# Patient Record
Sex: Female | Born: 1980
Health system: Southern US, Community
[De-identification: ages and names within clinical notes are randomized; demographics above are authoritative.]

## PROBLEM LIST (undated history)

## (undated) DIAGNOSIS — K219 Gastro-esophageal reflux disease without esophagitis: Secondary | ICD-10-CM

## (undated) DIAGNOSIS — IMO0001 Reserved for inherently not codable concepts without codable children: Secondary | ICD-10-CM

## (undated) DIAGNOSIS — J069 Acute upper respiratory infection, unspecified: Secondary | ICD-10-CM

## (undated) HISTORY — DX: Gastro-esophageal reflux disease without esophagitis: K21.9

## (undated) HISTORY — PX: BACK SURGERY: SHX140

## (undated) HISTORY — DX: Reserved for inherently not codable concepts without codable children: IMO0001

## (undated) HISTORY — DX: Acute upper respiratory infection, unspecified: J06.9

---

## 1999-10-17 ENCOUNTER — Other Ambulatory Visit: Admission: RE | Admit: 1999-10-17 | Discharge: 1999-10-17 | Payer: Self-pay | Admitting: Obstetrics and Gynecology

## 2002-10-27 ENCOUNTER — Other Ambulatory Visit: Admission: RE | Admit: 2002-10-27 | Discharge: 2002-10-27 | Payer: Self-pay | Admitting: Obstetrics and Gynecology

## 2003-12-07 ENCOUNTER — Other Ambulatory Visit: Admission: RE | Admit: 2003-12-07 | Discharge: 2003-12-07 | Payer: Self-pay | Admitting: Obstetrics and Gynecology

## 2003-12-08 ENCOUNTER — Emergency Department (HOSPITAL_COMMUNITY): Admission: EM | Admit: 2003-12-08 | Discharge: 2003-12-09 | Payer: Self-pay | Admitting: Emergency Medicine

## 2005-01-27 ENCOUNTER — Other Ambulatory Visit: Admission: RE | Admit: 2005-01-27 | Discharge: 2005-01-27 | Payer: Self-pay | Admitting: Obstetrics and Gynecology

## 2012-12-01 ENCOUNTER — Ambulatory Visit (INDEPENDENT_AMBULATORY_CARE_PROVIDER_SITE_OTHER): Payer: BC Managed Care – HMO | Admitting: Family Medicine

## 2012-12-01 ENCOUNTER — Encounter: Payer: Self-pay | Admitting: Family Medicine

## 2012-12-01 VITALS — BP 103/67 | Temp 98.7°F | Wt 109.6 lb

## 2012-12-01 DIAGNOSIS — J329 Chronic sinusitis, unspecified: Secondary | ICD-10-CM

## 2012-12-01 MED ORDER — CIPROFLOXACIN HCL 500 MG PO TABS: 500.0000 mg | ORAL_TABLET | Freq: Two times a day (BID) | ORAL | Status: AC

## 2012-12-01 NOTE — Progress Notes (Signed)
  Subjective:    Patient ID: Kathy Graham, female    DOB: 1981/03/30, 32 y.o.   MRN: [id number]  HPI Patient arrives office for evaluation. Headache. Congestion. Sore throat. Some drainage. Intermittent cough. Diminished energy. Under tremendous stress right now. Brother has just been diagnosed with malignant melanoma. Significant trouble sleeping   Review of Systems No vomiting no fever no rash ROS otherwise negative.    Objective:   Physical Exam  Alert no acute distress. HEENT normal except for frontal tenderness.. Some tender anterior nodes also. Neuro intact. Lungs clear. Heart regular in rhythm.      Assessment & Plan:  Impression sinusitis-discussed. Plan Cipro 500 twice a day 10 days. Symptomatic care discussed.

## 2012-12-05 ENCOUNTER — Encounter: Payer: Self-pay | Admitting: *Deleted

## 2012-12-05 ENCOUNTER — Telehealth: Payer: Self-pay | Admitting: Family Medicine

## 2012-12-05 NOTE — Telephone Encounter (Signed)
Discussed symptoms with patient. Currently taking antibiotics and has been on them for 3 days. Agreed to give antibiotics couple more days for results.

## 2012-12-05 NOTE — Telephone Encounter (Signed)
Having symptoms (Sinus pain and pressure, Stomach pain) that are bothering her, she can not sleep at night.  Sinuses very dry.  Would like to speak with a Nurse.  Please call.  Thanks

## 2012-12-07 ENCOUNTER — Encounter (HOSPITAL_COMMUNITY): Payer: Self-pay

## 2012-12-07 ENCOUNTER — Telehealth: Payer: Self-pay | Admitting: Family Medicine

## 2012-12-07 ENCOUNTER — Emergency Department (HOSPITAL_COMMUNITY)
Admission: EM | Admit: 2012-12-07 | Discharge: 2012-12-07 | Disposition: A | Discharge status: Home / Self Care | Payer: BC Managed Care – PPO | Attending: Emergency Medicine | Admitting: Emergency Medicine

## 2012-12-07 DIAGNOSIS — Z8719 Personal history of other diseases of the digestive system: Secondary | ICD-10-CM | POA: Insufficient documentation

## 2012-12-07 DIAGNOSIS — F411 Generalized anxiety disorder: Secondary | ICD-10-CM | POA: Insufficient documentation

## 2012-12-07 DIAGNOSIS — R000 Tachycardia, unspecified: Secondary | ICD-10-CM | POA: Insufficient documentation

## 2012-12-07 DIAGNOSIS — F419 Anxiety disorder, unspecified: Secondary | ICD-10-CM

## 2012-12-07 DIAGNOSIS — G4700 Insomnia, unspecified: Secondary | ICD-10-CM | POA: Insufficient documentation

## 2012-12-07 DIAGNOSIS — Z79899 Other long term (current) drug therapy: Secondary | ICD-10-CM | POA: Insufficient documentation

## 2012-12-07 MED ORDER — ALPRAZOLAM 0.25 MG PO TABS: 0.2500 mg | ORAL_TABLET | Freq: Every evening | ORAL | Status: DC | PRN

## 2012-12-07 NOTE — ED Notes (Signed)
Pt c/o of anxiety several days with increased onset today

## 2012-12-07 NOTE — Telephone Encounter (Signed)
Calandra,plz call pt back (again) with numbness in fingers, definietly agree with visit to review symptoms and possible interventions. Too complicated for just a phone call

## 2012-12-07 NOTE — ED Provider Notes (Signed)
History     CSN: [id number]  Arrival date & time 12/07/12  1442   First MD Initiated Contact with Patient 12/07/12 1511      Chief Complaint  Patient presents with  . Panic Attack    (Consider location/radiation/quality/duration/timing/severity/associated sxs/prior treatment) The history is provided by the patient.   patient here with increased anxiety x4 days. Notes insomnia as well as increased stressors at home. Was at work today and became very upset. No hallucinations. No suicidal ideations. Denies any prior history of anxiety or panic disorder. Symptoms are better when she relaxes and worse with environmental stimuli. Does note she also has been increased amounts of caffiene  Past Medical History  Diagnosis Date  . Reflux     History reviewed. No pertinent past surgical history.  No family history on file.  History  Substance Use Topics  . Smoking status: Never Smoker   . Smokeless tobacco: Not on file  . Alcohol Use: Not on file    OB History   Grav Para Term Preterm Abortions TAB SAB Ect Mult Living                  Review of Systems  All other systems reviewed and are negative.    Allergies  Augmentin and Cefzil  Home Medications   Current Outpatient Rx  Name  Route  Sig  Dispense  Refill  . ciprofloxacin (CIPRO) 500 MG tablet   Oral   Take 1 tablet (500 mg total) by mouth 2 (two) times daily.   20 tablet   0   . fexofenadine (ALLEGRA) 180 MG tablet   Oral   Take 180 mg by mouth daily.           BP 150/82  Pulse 116  Temp(Src) 97.6 F (36.4 C) (Oral)  Resp 18  SpO2 100%  LMP 11/24/2012  Physical Exam  Nursing note and vitals reviewed. Constitutional: She is oriented to person, place, and time. She appears well-developed and well-nourished.  Non-toxic appearance. No distress.  HENT:  Head: Normocephalic and atraumatic.  Eyes: Conjunctivae, EOM and lids are normal. Pupils are equal, round, and reactive to light.  Neck: Normal  range of motion. Neck supple. No tracheal deviation present. No mass present.  Cardiovascular: Regular rhythm and normal heart sounds.  Tachycardia present.  Exam reveals no gallop.   No murmur heard. Pulmonary/Chest: Effort normal and breath sounds normal. No stridor. No respiratory distress. She has no decreased breath sounds. She has no wheezes. She has no rhonchi. She has no rales.  Abdominal: Soft. Normal appearance and bowel sounds are normal. She exhibits no distension. There is no tenderness. There is no rebound and no CVA tenderness.  Musculoskeletal: Normal range of motion. She exhibits no edema and no tenderness.  Neurological: She is alert and oriented to person, place, and time. She has normal strength. No cranial nerve deficit or sensory deficit. GCS eye subscore is 4. GCS verbal subscore is 5. GCS motor subscore is 6.  Skin: Skin is warm and dry. No abrasion and no rash noted.  Psychiatric: Her speech is normal and behavior is normal. Her mood appears anxious.    ED Course  Procedures (including critical care time)  Labs Reviewed - No data to display No results found.   No diagnosis found.    MDM  Pt with signs and sx of anxiety, likely expalnation of her tachycardia, will rx xanax and she has f/u her pcp for tomorrow  Toy Baker, MD 12/07/12 1539

## 2012-12-07 NOTE — Telephone Encounter (Signed)
Pt states that Dr Brett Canales needs to call her himself, not a nurse. She wants to discuss things with him about something she needs extending from her visit with him on 12/01/12. Pt states Dr Brett Canales said she could call back an ask for him specifically for any issues.

## 2012-12-07 NOTE — Telephone Encounter (Signed)
Please see previous telephone message also.

## 2012-12-07 NOTE — Telephone Encounter (Signed)
Patient was given an appointment for 12/08/12 at 2:30pm.

## 2012-12-07 NOTE — Telephone Encounter (Signed)
Patient states that she is experiencing some anxiety trouble and having numbness in her finger due to her job requiring her to stay on her elbows a lot. Advised patient to schedule OV to discuss. Transferred to front desk for appointment.

## 2012-12-07 NOTE — ED Notes (Signed)
MD at bedside. 

## 2012-12-07 NOTE — Telephone Encounter (Signed)
Patient notified and will keep appointment for 12/08/12 at 2:30 pm.

## 2012-12-07 NOTE — Telephone Encounter (Signed)
Patient says that she needs someone to give her a call back reguarding her sinus infection that she was diagnosed with on last office visit

## 2012-12-08 ENCOUNTER — Encounter: Payer: Self-pay | Admitting: Family Medicine

## 2012-12-08 ENCOUNTER — Ambulatory Visit (INDEPENDENT_AMBULATORY_CARE_PROVIDER_SITE_OTHER): Payer: BC Managed Care – HMO | Admitting: Family Medicine

## 2012-12-08 VITALS — BP 112/72 | Wt 109.6 lb

## 2012-12-08 DIAGNOSIS — R209 Unspecified disturbances of skin sensation: Secondary | ICD-10-CM

## 2012-12-08 DIAGNOSIS — F411 Generalized anxiety disorder: Secondary | ICD-10-CM

## 2012-12-08 DIAGNOSIS — R202 Paresthesia of skin: Secondary | ICD-10-CM

## 2012-12-08 NOTE — Progress Notes (Signed)
  Subjective:    Patient ID: Kathy Graham, female    DOB: April 21, 1981, 32 y.o.   MRN: [id number]  HPI Had trouble sleeping. Difficulty breathing. Mind racing at times.   Numbness in ulnargion. Patient noted both arms and hands were numb and tingly last night when she had to go to the emergency room.  Felt vertigo and imbalance.  Felt numbness and tingling. Heart racing. bp was up. Caffeine use up. Daily walks.  Hx of spell in past similar. Anxious.   Major insomnia. Hx of some in the past.  Reflux at times. No hx in past. Review of Systems Some weight loss. No true chest pain. No abdominal pain.    Objective:   Physical Exam  Alert slight nasal congestion. HEENT otherwise normal. Lungs clear heart regular rate and rhythm. Sensory exam within normal limits. Cerebellar function normal. Neurological exam normal.      Assessment & Plan:  Impression #1 resolving sinusitis discussed. #2 paresthesias likely secondary to hyperventilation some times. Doubt serious neurological etiology. #3 anxiety with element of depression. Plan patient would like to consider counseling before starting on major medicines. We will give her prescription for 0.25 mg Xanax to use as needed. Regular diet and exercise encourage. WSL

## 2012-12-09 DIAGNOSIS — F32A Depression, unspecified: Secondary | ICD-10-CM | POA: Insufficient documentation

## 2012-12-09 DIAGNOSIS — F419 Anxiety disorder, unspecified: Secondary | ICD-10-CM | POA: Insufficient documentation

## 2012-12-09 DIAGNOSIS — F411 Generalized anxiety disorder: Secondary | ICD-10-CM | POA: Insufficient documentation

## 2012-12-09 DIAGNOSIS — R202 Paresthesia of skin: Secondary | ICD-10-CM | POA: Insufficient documentation

## 2013-01-18 ENCOUNTER — Ambulatory Visit (INDEPENDENT_AMBULATORY_CARE_PROVIDER_SITE_OTHER): Payer: BC Managed Care – PPO | Admitting: Psychiatry

## 2013-01-18 DIAGNOSIS — F4323 Adjustment disorder with mixed anxiety and depressed mood: Secondary | ICD-10-CM

## 2013-05-03 ENCOUNTER — Ambulatory Visit (INDEPENDENT_AMBULATORY_CARE_PROVIDER_SITE_OTHER): Payer: BC Managed Care – HMO | Admitting: Family Medicine

## 2013-05-03 ENCOUNTER — Encounter: Payer: Self-pay | Admitting: Family Medicine

## 2013-05-03 VITALS — BP 100/62 | Temp 99.0°F | Ht 63.0 in | Wt 115.8 lb

## 2013-05-03 DIAGNOSIS — J019 Acute sinusitis, unspecified: Secondary | ICD-10-CM

## 2013-05-03 NOTE — Progress Notes (Signed)
  Subjective:    Patient ID: Kathy Graham, female    DOB: [birth year], 32 y.o.   MRN: [id number]  Cough This is a new problem. The current episode started in the past 7 days. Associated symptoms include ear pain and a fever. Associated symptoms comments: Sinus pressure, runny nose. Treatments tried: vitamin C and salt water gargle. The treatment provided mild relief.   PMH benign   Review of Systems  Constitutional: Positive for fever.  HENT: Positive for ear pain.   Respiratory: Positive for cough.        Objective:   Physical Exam Eardrums normal mild sinus tenderness Lungs are clear hearts regular      Assessment & Plan:  Viral URI with possible secondary sinusitis if not improving in over the course of next few days to let us know no antibiotics currently

## 2013-05-03 NOTE — Patient Instructions (Signed)
Keep taking Allegra daily  Also may use the Zpack

## 2014-03-11 ENCOUNTER — Encounter (HOSPITAL_COMMUNITY): Payer: Self-pay | Admitting: Emergency Medicine

## 2014-03-11 ENCOUNTER — Emergency Department (HOSPITAL_COMMUNITY): Payer: BC Managed Care – PPO

## 2014-03-11 ENCOUNTER — Emergency Department (HOSPITAL_COMMUNITY)
Admission: EM | Admit: 2014-03-11 | Discharge: 2014-03-11 | Disposition: A | Discharge status: Home / Self Care | Payer: BC Managed Care – PPO | Attending: Emergency Medicine | Admitting: Emergency Medicine

## 2014-03-11 DIAGNOSIS — Z23 Encounter for immunization: Secondary | ICD-10-CM | POA: Insufficient documentation

## 2014-03-11 DIAGNOSIS — Z8719 Personal history of other diseases of the digestive system: Secondary | ICD-10-CM | POA: Diagnosis not present

## 2014-03-11 DIAGNOSIS — S61409A Unspecified open wound of unspecified hand, initial encounter: Secondary | ICD-10-CM | POA: Insufficient documentation

## 2014-03-11 DIAGNOSIS — Y9289 Other specified places as the place of occurrence of the external cause: Secondary | ICD-10-CM | POA: Diagnosis not present

## 2014-03-11 DIAGNOSIS — W268XXA Contact with other sharp object(s), not elsewhere classified, initial encounter: Secondary | ICD-10-CM | POA: Diagnosis not present

## 2014-03-11 DIAGNOSIS — Y9389 Activity, other specified: Secondary | ICD-10-CM | POA: Insufficient documentation

## 2014-03-11 DIAGNOSIS — S61411A Laceration without foreign body of right hand, initial encounter: Secondary | ICD-10-CM

## 2014-03-11 DIAGNOSIS — Z79899 Other long term (current) drug therapy: Secondary | ICD-10-CM | POA: Insufficient documentation

## 2014-03-11 MED ORDER — TETANUS-DIPHTH-ACELL PERTUSSIS 5-2.5-18.5 LF-MCG/0.5 IM SUSP
0.5000 mL | Freq: Once | INTRAMUSCULAR | Status: AC
  Administered 2014-03-11: 0.5 mL via INTRAMUSCULAR
  Filled 2014-03-11: qty 0.5

## 2014-03-11 MED ORDER — ACETAMINOPHEN-CODEINE #3 300-30 MG PO TABS
1.0000 | ORAL_TABLET | Freq: Four times a day (QID) | ORAL | Status: DC | PRN
Start: 2014-03-11 — End: 2014-03-14

## 2014-03-11 MED ORDER — NAPROXEN 500 MG PO TABS
500.0000 mg | ORAL_TABLET | Freq: Two times a day (BID) | ORAL | Status: DC | PRN
Start: 2014-03-11 — End: 2014-03-14

## 2014-03-11 NOTE — ED Notes (Signed)
Pt states that she was washing dishes and the glass broke and cut her right hand; pt with approx 2cm laceration to outer rt hand below 5th finger; bleeding controlled; clean dressing applied

## 2014-03-11 NOTE — ED Provider Notes (Signed)
CSN: [id number]     Arrival date & time 03/11/14  2011 History  This chart was scribed for a non-physician practitioner, Patty Sermons Camprubi-Soms, PA-C working with Arbie Cookey, MD by Martinique Peace, ED Scribe. The patient was seen in [facility]. The patient's care was started at 9:30 PM.      Chief Complaint  Patient presents with  . Extremity Laceration     Patient is a 33 y.o. female presenting with skin laceration. The history is provided by the patient. No language interpreter was used.  Laceration Location:  Hand Hand laceration location:  R hand (right 5th digit) Length (cm):  0.5 Depth:  Cutaneous Bleeding: controlled   Time since incident:  30 minutes Laceration mechanism:  Broken glass Pain details:    Quality: no pain.   Severity:  No pain   Progression:  Resolved Foreign body present:  No foreign bodies Relieved by:  Pressure Worsened by:  Nothing tried Ineffective treatments:  None tried Tetanus status:  Out of date   HPI Comments: Kathy Graham is a 33 y.o. female who presents to the Emergency Department complaining of laceration to right hand onset earlier today that occurred while pt was washing dishes and cut her hand on her hand a piece of glass. She states that she immediately applied pressure to wound and was able to get bleeding controlled. States the pain was well controlled. Pt reports last tetanus shot was in 2008. She denies any numbness or weakness to affected area, no weakness or loss of ROM to digits, no swelling or drainage. She further denies any issues with her blood that could make things worse.    Past Medical History  Diagnosis Date  . Reflux    Past Surgical History  Procedure Laterality Date  . Back surgery     No family history on file. History  Substance Use Topics  . Smoking status: Never Smoker   . Smokeless tobacco: Not on file  . Alcohol Use: No   OB History   Grav Para Term Preterm Abortions TAB SAB Ect Mult  Living                 Review of Systems  Musculoskeletal: Negative for joint swelling and myalgias.  Skin: Positive for wound (laceration to right hand).  Neurological: Negative for weakness and numbness.  Hematological: Negative for adenopathy. Does not bruise/bleed easily.   10 Systems reviewed and are negative for acute change except as noted in the HPI.    Allergies  Augmentin and Cefzil  Home Medications   Prior to Admission medications   Medication Sig Start Date End Date Taking? Authorizing Provider  drospirenone-ethinyl estradiol (YAZ,GIANVI,LORYNA) 3-0.02 MG tablet Take 1 tablet by mouth daily.   Yes Historical Provider, MD  fexofenadine (ALLEGRA) 180 MG tablet Take 180 mg by mouth daily.   Yes Historical Provider, MD  Multiple Vitamin (MULTIVITAMIN WITH MINERALS) TABS tablet Take 1 tablet by mouth daily.   Yes Historical Provider, MD   BP 121/70  Temp(Src) 98.6 F (37 C) (Oral)  Resp 16  SpO2 100%  LMP 03/04/2014 Physical Exam  Nursing note and vitals reviewed. Constitutional: She is oriented to person, place, and time. Vital signs are normal. She appears well-developed and well-nourished. No distress.  HENT:  Head: Normocephalic and atraumatic.  Mouth/Throat: Mucous membranes are normal.  Eyes: Conjunctivae and EOM are normal.  Neck: Normal range of motion. Neck supple.  Cardiovascular: Normal rate and intact distal pulses.  Distal pulses intact, cap refill intact in all digits  Pulmonary/Chest: Effort normal. No respiratory distress.  Abdominal: Normal appearance. She exhibits no distension.  Musculoskeletal: Normal range of motion.       Right hand: She exhibits laceration. She exhibits normal range of motion, no tenderness, normal two-point discrimination, normal capillary refill and no swelling. Normal sensation noted. Normal strength noted.       Hands: FROM intact in R hand at MCP, PIP, and DIP joints of all digits, no TTP or swelling, small 0.5cm  laceration just proximal to MCP joint of 5th digit. Bleeding well controlled. No FBs visualized. Cap refill brisk and present, sensation intact in all digits including with 2pt discrimination, strength 5/5 with all movements.  Neurological: She is alert and oriented to person, place, and time. She has normal strength. No sensory deficit.  Skin: Skin is warm and dry. Laceration noted.  0.5cm superficial lac to R hand, palmar aspect, just proximal to MCP joint of 5th digit, bleeding controlled  Psychiatric: She has a normal mood and affect. Her behavior is normal.    ED Course  LACERATION REPAIR Date/Time: 03/11/2014 10:00 PM Performed by: CAMPRUBI-SOMS, Chrystian Ressler STRUPP Authorized by: Corine Shelter Consent: Verbal consent obtained. Risks and benefits: risks, benefits and alternatives were discussed Consent given by: patient Patient understanding: patient states understanding of the procedure being performed Patient consent: the patient's understanding of the procedure matches consent given Patient identity confirmed: verbally with patient Body area: upper extremity Location details: right hand Laceration length: 0.5 cm Foreign bodies: no foreign bodies Tendon involvement: none Nerve involvement: none Vascular damage: no Patient sedated: no Preparation: Patient was prepped and draped in the usual sterile fashion. Irrigation solution: saline Irrigation method: jet lavage Amount of cleaning: standard Debridement: none Degree of undermining: none Skin closure: glue Approximation: close Approximation difficulty: simple Dressing: 4x4 sterile gauze Patient tolerance: Patient tolerated the procedure well with no immediate complications.   (including critical care time) Labs Review Labs Reviewed - No data to display   No results found for this or any previous visit. No results found.   Imaging Review Dg Hand Complete Right  03/11/2014   CLINICAL DATA:  Laceration  to fifth metacarpal region of the right hand.  EXAM: RIGHT HAND - COMPLETE 3+ VIEW  COMPARISON:  None.  FINDINGS: No fracture. Joints are normally aligned. No radiopaque foreign bodies.  IMPRESSION: No fracture or radiopaque foreign body.   Electronically Signed   By: Lajean Manes M.D.   On: 03/11/2014 21:18     EKG Interpretation None     Medications - No data to display  9:34 PM- Treatment plan was discussed with patient who verbalizes understanding and agrees which includes glue to close wound and tetanus shot.   MDM   Final diagnoses:  Hand laceration, right, initial encounter   33y/o female with small superficial lac to R hand, proximal to 5th MCP joint, neurovascularly intact without any tendon or nerve involvement. X-ray obtained showed no retained foreign body. Wound closed with Dermabond, bleeding well controlled. Discussed Dermabond care and followup with her PCP this week for wound check. Discussed signs of infection to watch for. Discussed not submerging this area and no use of ointments. Given pain medication in the home with. Tetanus updated today. Doubt need for hand followup, given that there was no tendon involvement. Did not give antibiotics here today given that the wound was on a clean glass and very superficial, but discussed with her that  those might be necessary if infection occurs which is why she needed to have a wound recheck. I explained the diagnosis and have given explicit precautions to return to the ER including for any other new or worsening symptoms. The patient understands and accepts the medical plan as it's been dictated and I have answered their questions. Discharge instructions concerning home care and prescriptions have been given. The patient is STABLE and is discharged to home in good condition.  I personally performed the services described in this documentation, which was scribed in my presence. The recorded information has been reviewed and is  accurate.  BP 121/70  Temp(Src) 98.6 F (37 C) (Oral)  Resp 16  SpO2 100%  LMP 03/04/2014  Meds ordered this encounter  Medications  . Tdap (BOOSTRIX) injection 0.5 mL    Sig:   . acetaminophen-codeine (TYLENOL #3) 300-30 MG per tablet    Sig: Take 1 tablet by mouth every 6 (six) hours as needed for moderate pain.    Dispense:  20 tablet    Refill:  0    Order Specific Question:  Supervising Provider    Answer:  Noemi Chapel D [[id number]]  . naproxen (NAPROSYN) 500 MG tablet    Sig: Take 1 tablet (500 mg total) by mouth 2 (two) times daily as needed for mild pain, moderate pain or headache (TAKE WITH MEALS.).    Dispense:  20 tablet    Refill:  0    Order Specific Question:  Supervising Provider    Answer:  Johnna Acosta [address] Camprubi-Soms, PA-C 03/11/14 2227

## 2014-03-11 NOTE — Discharge Instructions (Signed)
Keep wound and clean with mild soap and water beginning in 24 hours from now. Keep area covered with a topical bandage, keep bandage dry, and do not submerge in water for 24 hours. DO NOT USE OINTMENTS OVER THIS AREA! Ice and elevate for additional pain relief and swelling. Alternate between naprosyn and Tylenol #3 for additional pain relief. Follow up with your primary care doctor or the [facility] [place] in approximately 3 days for wound recheck. Monitor area for signs of infection to include, but not limited to: increasing pain, redness, drainage/pus, or swelling. Return to emergency department for emergent changing or worsening symptoms.   Laceration Care, Adult A laceration is a cut that goes through all layers of the skin. The cut goes into the tissue beneath the skin. HOME CARE For stitches (sutures) or staples:  Keep the cut clean and dry.  If you have a bandage (dressing), change it at least once a day. Change the bandage if it gets wet or dirty, or as told by your doctor.  Wash the cut with soap and water 2 times a day. Rinse the cut with water. Pat it dry with a clean towel.  Put a thin layer of medicated cream on the cut as told by your doctor.  You may shower after the first 24 hours. Do not soak the cut in water until the stitches are removed.  Only take medicines as told by your doctor.  Have your stitches or staples removed as told by your doctor. For skin adhesive strips:  Keep the cut clean and dry.  Do not get the strips wet. You may take a bath, but be careful to keep the cut dry.  If the cut gets wet, pat it dry with a clean towel.  The strips will fall off on their own. Do not remove the strips that are still stuck to the cut. For wound glue:  You may shower or take baths. Do not soak or scrub the cut. Do not swim. Avoid heavy sweating until the glue falls off on its own. After a shower or bath, pat the cut dry with a clean towel.  Do not put  medicine on your cut until the glue falls off.  If you have a bandage, do not put tape over the glue.  Avoid lots of sunlight or tanning lamps until the glue falls off. Put sunscreen on the cut for the first year to reduce your scar.  The glue will fall off on its own. Do not pick at the glue. You may need a tetanus shot if:  You cannot remember when you had your last tetanus shot.  You have never had a tetanus shot. If you need a tetanus shot and you choose not to have one, you may get tetanus. Sickness from tetanus can be serious. GET HELP RIGHT AWAY IF:   Your pain does not get better with medicine.  Your arm, hand, leg, or foot loses feeling (numbness) or changes color.  Your cut is bleeding.  Your joint feels weak, or you cannot use your joint.  You have painful lumps on your body.  Your cut is red, puffy (swollen), or painful.  You have a red line on the skin near the cut.  You have yellowish-white fluid (pus) coming from the cut.  You have a fever.  You have a bad smell coming from the cut or bandage.  Your cut breaks open before or after stitches are removed.  You  notice something coming out of the cut, such as wood or glass.  You cannot move a finger or toe. MAKE SURE YOU:   Understand these instructions.  Will watch your condition.  Will get help right away if you are not doing well or get worse. Document Released: 11/25/2007 Document Revised: 08/31/2011 Document Reviewed: 12/02/2010 [facility] Patient Information 2015 [place], [place]. This information is not intended to replace advice given to you by your health care provider. Make sure you discuss any questions you have with your health care provider.  Stitches, Staples, or Skin Adhesive Strips  Stitches (sutures), staples, and skin adhesive strips hold the skin together as it heals. They will usually be in place for 7 days or less. HOME CARE  Wash your hands with soap and water before and after you  touch your wound.  Only take medicine as told by your doctor.  Cover your wound only if your doctor told you to. Otherwise, leave it open to air.  Do not get your stitches wet or dirty. If they get dirty, dab them gently with a clean washcloth. Wet the washcloth with soapy water. Do not rub. Pat them dry gently.  Do not put medicine or medicated cream on your stitches unless your doctor told you to.  Do not take out your own stitches or staples. Skin adhesive strips will fall off by themselves.  Do not pick at the wound. Picking can cause an infection.  Do not miss your follow-up appointment.  If you have problems or questions, call your doctor. GET HELP RIGHT AWAY IF:   You have a temperature by mouth above 102 F (38.9 C), not controlled by medicine.  You have chills.  You have redness or pain around your stitches.  There is puffiness (swelling) around your stitches.  You notice fluid (drainage) from your stitches.  There is a bad smell coming from your wound. MAKE SURE YOU:  Understand these instructions.  Will watch your condition.  Will get help if you are not doing well or get worse. Document Released: 04/05/2009 Document Revised: 08/31/2011 Document Reviewed: 04/05/2009 [facility] Patient Information 2015 [place], [place]. This information is not intended to replace advice given to you by your health care provider. Make sure you discuss any questions you have with your health care provider.

## 2014-03-12 NOTE — ED Provider Notes (Signed)
Medical screening examination/treatment/procedure(s) were performed by non-physician practitioner and as supervising physician I was immediately available for consultation/collaboration.   Houston Siren III, MD 03/12/14 [phone]

## 2014-03-14 ENCOUNTER — Ambulatory Visit (INDEPENDENT_AMBULATORY_CARE_PROVIDER_SITE_OTHER): Payer: BC Managed Care – HMO | Admitting: Nurse Practitioner

## 2014-03-14 ENCOUNTER — Encounter: Payer: Self-pay | Admitting: Nurse Practitioner

## 2014-03-14 VITALS — BP 100/70 | Ht 63.0 in | Wt 117.0 lb

## 2014-03-14 DIAGNOSIS — Z5189 Encounter for other specified aftercare: Secondary | ICD-10-CM

## 2014-03-14 DIAGNOSIS — S61409A Unspecified open wound of unspecified hand, initial encounter: Secondary | ICD-10-CM

## 2014-03-14 DIAGNOSIS — S61411D Laceration without foreign body of right hand, subsequent encounter: Secondary | ICD-10-CM

## 2014-03-14 NOTE — Patient Instructions (Signed)
[phone] units vitamin D per day

## 2014-03-16 ENCOUNTER — Encounter: Payer: Self-pay | Admitting: Nurse Practitioner

## 2014-03-16 NOTE — Progress Notes (Signed)
Subjective:  Presents for recheck of a laceration on her right hand that she cut with a glass while washing dishes. Was seen at the local ED. glue was applied to wound. Also received tetanus shot. No fever.  Objective:   BP 100/70  Ht 5\' 3"  (1.6 m)  Wt 117 lb (53.071 kg)  BMI 20.73 kg/m2  LMP 03/04/2014 NAD. Alert, oriented. Superficial laceration right hand healing well without any evidence of infection. Minimal tenderness to palpation. Edges are sealed.  Assessment: Hand laceration, right, subsequent encounter  Plan: Warning signs reviewed regarding infection. Expect gradual resolution. Call back if any further problems.

## 2014-10-01 ENCOUNTER — Encounter: Payer: Self-pay | Admitting: Family Medicine

## 2014-10-01 ENCOUNTER — Ambulatory Visit (INDEPENDENT_AMBULATORY_CARE_PROVIDER_SITE_OTHER): Payer: BLUE CROSS/BLUE SHIELD | Admitting: Family Medicine

## 2014-10-01 VITALS — BP 98/60 | Temp 99.1°F | Ht 63.0 in | Wt 116.2 lb

## 2014-10-01 DIAGNOSIS — J329 Chronic sinusitis, unspecified: Secondary | ICD-10-CM

## 2014-10-01 MED ORDER — AZITHROMYCIN 250 MG PO TABS: ORAL_TABLET | ORAL | Status: AC

## 2014-10-01 NOTE — Progress Notes (Signed)
   Subjective:    Patient ID: Kathy Graham, female    DOB: 08-18-1980, 34 y.o.   MRN: [id number]  Sinusitis This is a new problem. The current episode started yesterday. The problem is unchanged. There has been no fever. The pain is moderate. Associated symptoms include congestion and coughing. Past treatments include saline sprays. The treatment provided no relief.   Patient states that she has no other concerns at this time.   Spring allergies using alllegram  strted yest with throat sxratchy  Throat painful  Feels cong in the chest   Frontal and cheek compression     Review of Systems  HENT: Positive for congestion.   Respiratory: Positive for cough.        Objective:   Physical Exam  Alert no acute distress. HEENT moderate his congestion frontal tenderness. Pharynx slight erythema neck supple. Lungs clear heart regular in rhythm.      Assessment & Plan:  Impression acute rhinosinusitis plan antibiotics prescribed. Symptom care discussed. Warning signs discussed WSL

## 2015-09-23 DIAGNOSIS — Z01419 Encounter for gynecological examination (general) (routine) without abnormal findings: Secondary | ICD-10-CM | POA: Diagnosis not present

## 2015-09-23 DIAGNOSIS — Z6820 Body mass index (BMI) 20.0-20.9, adult: Secondary | ICD-10-CM | POA: Diagnosis not present

## 2015-12-11 DIAGNOSIS — Z1283 Encounter for screening for malignant neoplasm of skin: Secondary | ICD-10-CM | POA: Diagnosis not present

## 2015-12-11 DIAGNOSIS — L821 Other seborrheic keratosis: Secondary | ICD-10-CM | POA: Diagnosis not present

## 2016-05-07 ENCOUNTER — Encounter: Payer: Self-pay | Admitting: Nurse Practitioner

## 2016-05-07 ENCOUNTER — Ambulatory Visit (INDEPENDENT_AMBULATORY_CARE_PROVIDER_SITE_OTHER): Payer: BLUE CROSS/BLUE SHIELD | Admitting: Nurse Practitioner

## 2016-05-07 VITALS — BP 108/64 | Ht 64.0 in | Wt 119.0 lb

## 2016-05-07 DIAGNOSIS — J329 Chronic sinusitis, unspecified: Secondary | ICD-10-CM

## 2016-05-07 DIAGNOSIS — J310 Chronic rhinitis: Secondary | ICD-10-CM

## 2016-05-07 MED ORDER — AZITHROMYCIN 250 MG PO TABS: ORAL_TABLET | ORAL | 0 refills | Status: DC

## 2016-05-07 NOTE — Progress Notes (Signed)
Subjective:  Presents for complaints of sinus symptoms over the past week. Typically has trouble during season change. Maxillary area sinus pressure and headache. Slight ear pain. Scratchy throat. Slight nonproductive cough. No wheezing. Has been using saline nasal rinse.  Objective:   BP 108/64   Ht 5\' 4"  (1.626 m)   Wt 119 lb (54 kg)   BMI 20.43 kg/m  NAD. Alert, oriented. TMs retracted bilateral, no erythema. Pharynx mildly erythematous with green PND noted. Neck supple with mild soft anterior adenopathy. Lungs clear. Heart regular rate rhythm.  Assessment: Rhinosinusitis  Plan:  Meds ordered this encounter  Medications  . azithromycin (ZITHROMAX Z-PAK) 250 MG tablet    Sig: Take 2 tablets (500 mg) on  Day 1,  followed by 1 tablet (250 mg) once daily on Days 2 through 5.    Dispense:  6 each    Refill:  0    Order Specific Question:   Supervising Provider    Answer:   Mikey Kirschner [[id number]]   Continue saline nasal rinse as directed. Add steroid nasal spray and continue Allegra. Call back if symptoms worsen or persist.

## 2016-05-07 NOTE — Patient Instructions (Signed)
Nasacort or Rhinocort OTC antihistamine

## 2016-07-15 ENCOUNTER — Ambulatory Visit (INDEPENDENT_AMBULATORY_CARE_PROVIDER_SITE_OTHER): Payer: BLUE CROSS/BLUE SHIELD | Admitting: Nurse Practitioner

## 2016-07-15 ENCOUNTER — Encounter: Payer: Self-pay | Admitting: Nurse Practitioner

## 2016-07-15 VITALS — BP 102/74 | Ht 64.0 in | Wt 120.2 lb

## 2016-07-15 DIAGNOSIS — L609 Nail disorder, unspecified: Secondary | ICD-10-CM | POA: Diagnosis not present

## 2016-07-15 NOTE — Patient Instructions (Addendum)
Tea tree oil Vicks Vaporub Fung off  Fungal Nail Infection Introduction Fungal nail infection is a common fungal infection of the toenails or fingernails. This condition affects toenails more often than fingernails. More than one nail may be infected. The condition can be passed from person to person (is contagious). What are the causes? This condition is caused by a fungus. Several types of funguses can cause the infection. These funguses are common in moist and warm areas. If your hands or feet come into contact with the fungus, it may get into a crack in your fingernail or toenail and cause the infection. What increases the risk? The following factors may make you more likely to develop this condition:  Being female.  Having diabetes.  Being of older age.  Living with someone who has the fungus.  Walking barefoot in areas where the fungus thrives, such as showers or locker rooms.  Having poor circulation.  Wearing shoes and socks that cause your feet to sweat.  Having athlete's foot.  Having a nail injury or history of a recent nail surgery.  Having psoriasis.  Having a weak body defense system (immune system). What are the signs or symptoms? Symptoms of this condition include:  A pale spot on the nail.  Thickening of the nail.  A nail that becomes yellow or brown.  A brittle or ragged nail edge.  A crumbling nail.  A nail that has lifted away from the nail bed. How is this diagnosed? This condition is diagnosed with a physical exam. Your health care provider may take a scraping or clipping from your nail to test for the fungus. How is this treated? Mild infections do not need treatment. If you have significant nail changes, treatment may include:  Oral antifungal medicines. You may need to take the medicine for several weeks or several months, and you may not see the results for a long time. These medicines can cause side effects. Ask your health care provider  what problems to watch for.  Antifungal nail polish and nail cream. These may be used along with oral antifungal medicines.  Laser treatment of the nail.  Surgery to remove the nail. This may be needed for the most severe infections. Treatment takes a long time, and the infection may come back. Follow these instructions at home: Medicines  Take or apply over-the-counter and prescription medicines only as told by your health care provider.  Ask your health care provider about using over-the-counter mentholated ointment on your nails. Lifestyle  Do not share personal items, such as towels or nail clippers.  Trim your nails often.  Wash and dry your hands and feet every day.  Wear absorbent socks, and change your socks frequently.  Wear shoes that allow air to circulate, such as sandals or canvas tennis shoes. Throw out old shoes.  Wear rubber gloves if you are working with your hands in wet areas.  Do not walk barefoot in shower rooms or locker rooms.  Do not use a nail salon that does not use clean instruments.  Do not use artificial nails. General instructions  Keep all follow-up visits as told by your health care provider. This is important.  Use antifungal foot powder on your feet and in your shoes. Contact a health care provider if: Your infection is not getting better or it is getting worse after several months. This information is not intended to replace advice given to you by your health care provider. Make sure you discuss any questions  you have with your health care provider. Document Released: 06/05/2000 Document Revised: 11/14/2015 Document Reviewed: 12/10/2014  2017 Elsevier

## 2016-07-15 NOTE — Progress Notes (Signed)
Subjective:  36 yo female, seen today for wellness exam.  Reports generally feeling well, exercises regularly, and endorses a good diet.  Pt. Is seen annually for gynecological exam with last pap complete 02/2014.  Pt. Reports she has few episodes of sadness r/t passing of brother in 2014, denies depression and anxiety is relieved with physical activity.  Pt's concerns this visit is a discoloration in R. Big toe and occaisonal episodes of slight weakness when she has prolonged times of not eating.  She denies pain in toe and has not tried any interventions.  Weakness is resolved with eating food.  No other concerns at this time.    Objective:   BP 102/74   Ht 5\' 4"  (1.626 m)   Wt 120 lb 3.2 oz (54.5 kg)   BMI 20.63 kg/m  NAD.  Pt is Alert and oriented.  Lungs: CTA bilat., Heart: RRR, no murmurs Skin:  Nail on R.big toe slightly separated from nail bed with small area of white discoloration, no redness, swelling or drainage  Assessment:  Nail abnormality   Plan:  Pt. Will attempt OTC interventions to R. Toe.  F/U in 2 months if there is no improvement or has worsening symptoms  No labs/med changes at this time  Return for physical in 1 year, or as needed   Recommend HIV test as per guidelines ages 15-65

## 2016-08-06 DIAGNOSIS — H5213 Myopia, bilateral: Secondary | ICD-10-CM | POA: Diagnosis not present

## 2016-09-28 DIAGNOSIS — Z1283 Encounter for screening for malignant neoplasm of skin: Secondary | ICD-10-CM | POA: Diagnosis not present

## 2016-09-28 DIAGNOSIS — D225 Melanocytic nevi of trunk: Secondary | ICD-10-CM | POA: Diagnosis not present

## 2016-09-28 DIAGNOSIS — L821 Other seborrheic keratosis: Secondary | ICD-10-CM | POA: Diagnosis not present

## 2016-12-11 DIAGNOSIS — Z6820 Body mass index (BMI) 20.0-20.9, adult: Secondary | ICD-10-CM | POA: Diagnosis not present

## 2016-12-11 DIAGNOSIS — Z01419 Encounter for gynecological examination (general) (routine) without abnormal findings: Secondary | ICD-10-CM | POA: Diagnosis not present

## 2017-03-19 ENCOUNTER — Telehealth: Payer: Self-pay | Admitting: Family Medicine

## 2017-03-19 NOTE — Telephone Encounter (Signed)
Pt is going to [place] next week and is needing to know if there is any vaccinations that she may need. Please advise.

## 2017-03-21 NOTE — Telephone Encounter (Signed)
I think its too late to address this (vaccines take a while to work),to let her know we need to know but rural or staying in city? For how long?

## 2017-03-22 NOTE — Telephone Encounter (Signed)
Staying in city is [place] , and less than a week from departure no vaccines to administer, would use caution and avoid "adventurous eating", make sure everything well cooked, all drinks bottled, etc. Too late to administer any appropriate vaccines and we do'nt carry anyway, would have had to set up a visit to a travel vaccine clinic a good month before visit for future information

## 2017-03-22 NOTE — Telephone Encounter (Signed)
Staying in a city that is about [place] hours from [place]. Leaving this Friday and staying for 2 weeks.

## 2017-03-22 NOTE — Telephone Encounter (Signed)
Discussed with pt. Pt verbalized understanding.  °

## 2017-04-21 DIAGNOSIS — B351 Tinea unguium: Secondary | ICD-10-CM | POA: Diagnosis not present

## 2017-04-21 DIAGNOSIS — L258 Unspecified contact dermatitis due to other agents: Secondary | ICD-10-CM | POA: Diagnosis not present

## 2017-04-26 DIAGNOSIS — B351 Tinea unguium: Secondary | ICD-10-CM | POA: Diagnosis not present

## 2017-08-16 ENCOUNTER — Encounter: Payer: Self-pay | Admitting: Nurse Practitioner

## 2017-08-16 ENCOUNTER — Ambulatory Visit: Payer: BLUE CROSS/BLUE SHIELD | Admitting: Nurse Practitioner

## 2017-08-16 VITALS — BP 136/82 | Temp 99.2°F | Ht 64.0 in | Wt 125.0 lb

## 2017-08-16 DIAGNOSIS — J0100 Acute maxillary sinusitis, unspecified: Secondary | ICD-10-CM | POA: Diagnosis not present

## 2017-08-16 MED ORDER — AZITHROMYCIN 250 MG PO TABS: ORAL_TABLET | ORAL | 0 refills | Status: DC

## 2017-08-16 NOTE — Progress Notes (Signed)
Subjective:  Presents for c/o sinus congestion for the past 4 days.  Off-and-on low-grade fever.  Maxillary area sinus headache that radiates to the teeth.  Runny nose.  Bilateral ear pain.  No sore throat.  Minimal cough that began today.  No wheezing.  Now producing yellow mucus.  Objective:   BP 136/82   Temp 99.2 F (37.3 C) (Oral)   Ht 5\' 4"  (1.626 m)   Wt 125 lb (56.7 kg)   BMI 21.46 kg/m  NAD.  Alert, oriented.  TMs retracted, no erythema.  Pharynx mildly injected with yellowish PND noted.  Neck supple with mild soft anterior adenopathy.  Lungs clear.  Heart regular rate rhythm.  Assessment:  Acute non-recurrent maxillary sinusitis    Plan:   Meds ordered this encounter  Medications  . azithromycin (ZITHROMAX Z-PAK) 250 MG tablet    Sig: Take 2 tablets (500 mg) on  Day 1,  followed by 1 tablet (250 mg) once daily on Days 2 through 5.    Dispense:  6 each    Refill:  0    Order Specific Question:   Supervising Provider    Answer:   Mikey Kirschner [[id number]]   Continue OTC meds for allergies and congestion.  Call back if worsens or persist.  Gets regular preventive health physicals with her gynecologist in [place].

## 2017-09-01 DIAGNOSIS — S39012A Strain of muscle, fascia and tendon of lower back, initial encounter: Secondary | ICD-10-CM | POA: Diagnosis not present

## 2017-09-01 DIAGNOSIS — S3992XA Unspecified injury of lower back, initial encounter: Secondary | ICD-10-CM | POA: Diagnosis not present

## 2017-09-29 DIAGNOSIS — Z1283 Encounter for screening for malignant neoplasm of skin: Secondary | ICD-10-CM | POA: Diagnosis not present

## 2017-09-29 DIAGNOSIS — L821 Other seborrheic keratosis: Secondary | ICD-10-CM | POA: Diagnosis not present

## 2017-12-17 DIAGNOSIS — Z6820 Body mass index (BMI) 20.0-20.9, adult: Secondary | ICD-10-CM | POA: Diagnosis not present

## 2017-12-17 DIAGNOSIS — Z124 Encounter for screening for malignant neoplasm of cervix: Secondary | ICD-10-CM | POA: Diagnosis not present

## 2017-12-17 DIAGNOSIS — Z01419 Encounter for gynecological examination (general) (routine) without abnormal findings: Secondary | ICD-10-CM | POA: Diagnosis not present

## 2018-02-28 DIAGNOSIS — S80862D Insect bite (nonvenomous), left lower leg, subsequent encounter: Secondary | ICD-10-CM | POA: Diagnosis not present

## 2018-06-03 DIAGNOSIS — R300 Dysuria: Secondary | ICD-10-CM | POA: Diagnosis not present

## 2018-06-03 DIAGNOSIS — N760 Acute vaginitis: Secondary | ICD-10-CM | POA: Diagnosis not present

## 2018-06-13 ENCOUNTER — Ambulatory Visit: Payer: BLUE CROSS/BLUE SHIELD | Admitting: Family Medicine

## 2018-06-13 VITALS — BP 118/82 | Temp 98.4°F | Ht 64.0 in | Wt 122.8 lb

## 2018-06-13 DIAGNOSIS — J019 Acute sinusitis, unspecified: Secondary | ICD-10-CM | POA: Diagnosis not present

## 2018-06-13 MED ORDER — FLUCONAZOLE 150 MG PO TABS: ORAL_TABLET | ORAL | 0 refills | Status: DC

## 2018-06-13 MED ORDER — AMOXICILLIN 500 MG PO CAPS: 500.0000 mg | ORAL_CAPSULE | Freq: Three times a day (TID) | ORAL | 0 refills | Status: DC
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
## 2018-06-16 ENCOUNTER — Encounter: Payer: Self-pay | Admitting: Family Medicine

## 2018-06-16 ENCOUNTER — Ambulatory Visit: Payer: BLUE CROSS/BLUE SHIELD | Admitting: Family Medicine

## 2018-06-16 VITALS — Temp 99.1°F | Wt 126.8 lb

## 2018-06-16 DIAGNOSIS — J019 Acute sinusitis, unspecified: Secondary | ICD-10-CM | POA: Diagnosis not present

## 2018-06-16 MED ORDER — DOXYCYCLINE HYCLATE 100 MG PO CAPS: 100.0000 mg | ORAL_CAPSULE | Freq: Two times a day (BID) | ORAL | 0 refills | Status: DC

## 2018-06-16 NOTE — Progress Notes (Signed)
   Subjective:    Patient ID: Kathy Graham, female    DOB: [birth year], 37 y.o.   MRN: [id number]  Sinusitis  This is a recurrent problem. The current episode started in the past 7 days. Maximum temperature: 99.6. Associated symptoms include congestion, coughing, ear pain, headaches and a sore throat. Pertinent negatives include no shortness of breath. (Right ear pain, runny nose) Treatments tried: was seen on Monday adn given amoxicillanl hot tea with honey and allegra. The treatment provided no relief.   Sinus pain bilat despite the antoibiotic  Now into the chest with cough and discolored phlegm Energy level low Some low grade fever and sweats  no doe Ear pressure feeling right side   Review of Systems  Constitutional: Negative for activity change and fever.  HENT: Positive for congestion, ear pain, rhinorrhea and sore throat.   Eyes: Negative for discharge.  Respiratory: Positive for cough. Negative for shortness of breath and wheezing.   Cardiovascular: Negative for chest pain.  Neurological: Positive for headaches.       Objective:   Physical Exam Vitals signs and nursing note reviewed.  Constitutional:      Appearance: She is well-developed.  HENT:     Head: Normocephalic.     Nose: Nose normal.     Mouth/Throat:     Pharynx: No oropharyngeal exudate.  Neck:     Musculoskeletal: Neck supple.  Cardiovascular:     Rate and Rhythm: Normal rate.     Heart sounds: Normal heart sounds. No murmur.  Pulmonary:     Effort: Pulmonary effort is normal.     Breath sounds: Normal breath sounds. No wheezing.  Lymphadenopathy:     Cervical: No cervical adenopathy.  Skin:    General: Skin is warm and dry.    Patient does not appear toxic.  Should get better with treatment.  Hold off on any x-rays and lab work currently.       Assessment & Plan:  Patient was seen today for upper respiratory illness. It is felt that the patient is dealing with sinusitis.  Antibiotics  were prescribed today. Importance of compliance with medication was discussed.  Symptoms should gradually resolve over the course of the next several days. If high fevers, progressive illness, difficulty breathing, worsening condition or failure for symptoms to improve over the next several days then the patient is to follow-up.  If any emergent conditions the patient is to follow-up in the emergency department otherwise to follow-up in the office.

## 2018-06-29 DIAGNOSIS — Z6821 Body mass index (BMI) 21.0-21.9, adult: Secondary | ICD-10-CM | POA: Diagnosis not present

## 2018-06-29 DIAGNOSIS — B373 Candidiasis of vulva and vagina: Secondary | ICD-10-CM | POA: Diagnosis not present

## 2018-07-11 DIAGNOSIS — Z6821 Body mass index (BMI) 21.0-21.9, adult: Secondary | ICD-10-CM | POA: Diagnosis not present

## 2018-07-11 DIAGNOSIS — B373 Candidiasis of vulva and vagina: Secondary | ICD-10-CM | POA: Diagnosis not present

## 2018-12-16 ENCOUNTER — Emergency Department (HOSPITAL_COMMUNITY): Payer: BC Managed Care – PPO

## 2018-12-16 ENCOUNTER — Emergency Department (HOSPITAL_COMMUNITY)
Admission: EM | Admit: 2018-12-16 | Discharge: 2018-12-16 | Disposition: A | Discharge status: Home / Self Care | Payer: BC Managed Care – PPO | Attending: Gastroenterology | Admitting: Gastroenterology

## 2018-12-16 ENCOUNTER — Encounter (HOSPITAL_COMMUNITY)
Admission: EM | Disposition: A | Discharge status: Home / Self Care | Payer: Self-pay | Source: Home / Self Care | Attending: Emergency Medicine

## 2018-12-16 ENCOUNTER — Encounter (HOSPITAL_COMMUNITY): Payer: Self-pay

## 2018-12-16 ENCOUNTER — Other Ambulatory Visit: Payer: Self-pay

## 2018-12-16 DIAGNOSIS — K449 Diaphragmatic hernia without obstruction or gangrene: Secondary | ICD-10-CM | POA: Diagnosis not present

## 2018-12-16 DIAGNOSIS — Z79899 Other long term (current) drug therapy: Secondary | ICD-10-CM | POA: Diagnosis not present

## 2018-12-16 DIAGNOSIS — Z809 Family history of malignant neoplasm, unspecified: Secondary | ICD-10-CM | POA: Insufficient documentation

## 2018-12-16 DIAGNOSIS — Z881 Allergy status to other antibiotic agents status: Secondary | ICD-10-CM | POA: Insufficient documentation

## 2018-12-16 DIAGNOSIS — R1319 Other dysphagia: Secondary | ICD-10-CM

## 2018-12-16 DIAGNOSIS — K209 Esophagitis, unspecified: Secondary | ICD-10-CM | POA: Diagnosis not present

## 2018-12-16 DIAGNOSIS — Z8371 Family history of colonic polyps: Secondary | ICD-10-CM | POA: Insufficient documentation

## 2018-12-16 DIAGNOSIS — Z800 Family history of malignant neoplasm of digestive organs: Secondary | ICD-10-CM | POA: Diagnosis not present

## 2018-12-16 DIAGNOSIS — Z981 Arthrodesis status: Secondary | ICD-10-CM | POA: Insufficient documentation

## 2018-12-16 DIAGNOSIS — T18128A Food in esophagus causing other injury, initial encounter: Secondary | ICD-10-CM

## 2018-12-16 DIAGNOSIS — Z880 Allergy status to penicillin: Secondary | ICD-10-CM | POA: Insufficient documentation

## 2018-12-16 DIAGNOSIS — K2950 Unspecified chronic gastritis without bleeding: Secondary | ICD-10-CM | POA: Insufficient documentation

## 2018-12-16 DIAGNOSIS — Q394 Esophageal web: Secondary | ICD-10-CM | POA: Insufficient documentation

## 2018-12-16 DIAGNOSIS — K210 Gastro-esophageal reflux disease with esophagitis: Secondary | ICD-10-CM | POA: Insufficient documentation

## 2018-12-16 DIAGNOSIS — T189XXA Foreign body of alimentary tract, part unspecified, initial encounter: Secondary | ICD-10-CM | POA: Insufficient documentation

## 2018-12-16 DIAGNOSIS — K200 Eosinophilic esophagitis: Secondary | ICD-10-CM | POA: Diagnosis not present

## 2018-12-16 DIAGNOSIS — R1310 Dysphagia, unspecified: Secondary | ICD-10-CM

## 2018-12-16 DIAGNOSIS — Z1159 Encounter for screening for other viral diseases: Secondary | ICD-10-CM | POA: Insufficient documentation

## 2018-12-16 DIAGNOSIS — K2971 Gastritis, unspecified, with bleeding: Secondary | ICD-10-CM

## 2018-12-16 DIAGNOSIS — X58XXXA Exposure to other specified factors, initial encounter: Secondary | ICD-10-CM | POA: Insufficient documentation

## 2018-12-16 DIAGNOSIS — T18108A Unspecified foreign body in esophagus causing other injury, initial encounter: Secondary | ICD-10-CM

## 2018-12-16 DIAGNOSIS — Z8249 Family history of ischemic heart disease and other diseases of the circulatory system: Secondary | ICD-10-CM | POA: Diagnosis not present

## 2018-12-16 DIAGNOSIS — R1110 Vomiting, unspecified: Secondary | ICD-10-CM | POA: Diagnosis not present

## 2018-12-16 DIAGNOSIS — R0989 Other specified symptoms and signs involving the circulatory and respiratory systems: Secondary | ICD-10-CM | POA: Diagnosis not present

## 2018-12-16 HISTORY — PX: ESOPHAGOGASTRODUODENOSCOPY: SHX5428

## 2018-12-16 HISTORY — PX: BIOPSY: SHX5522

## 2018-12-16 LAB — CBC
HCT: 41.4 % (ref 36.0–46.0)
Hemoglobin: 13.2 g/dL (ref 12.0–15.0)
MCH: 29.1 pg (ref 26.0–34.0)
MCHC: 31.9 g/dL (ref 30.0–36.0)
MCV: 91.4 fL (ref 80.0–100.0)
Platelets: 276 10*3/uL (ref 150–400)
RBC: 4.53 MIL/uL (ref 3.87–5.11)
RDW: 13.2 % (ref 11.5–15.5)
WBC: 7.4 10*3/uL (ref 4.0–10.5)
nRBC: 0 % (ref 0.0–0.2)

## 2018-12-16 LAB — BASIC METABOLIC PANEL
Anion gap: 11 (ref 5–15)
BUN: 14 mg/dL (ref 6–20)
CO2: 23 mmol/L (ref 22–32)
Calcium: 9.3 mg/dL (ref 8.9–10.3)
Chloride: 104 mmol/L (ref 98–111)
Creatinine, Ser: 0.7 mg/dL (ref 0.44–1.00)
GFR calc Af Amer: >60 mL/min (ref >60)
GFR calc non Af Amer: >60 mL/min (ref >60)
Glucose, Bld: 105 mg/dL — ABNORMAL HIGH (ref 70–99)
Potassium: 3.7 mmol/L (ref 3.5–5.1)
Sodium: 138 mmol/L (ref 135–145)

## 2018-12-16 LAB — SARS CORONAVIRUS 2 BY RT PCR (HOSPITAL ORDER, PERFORMED IN ~~LOC~~ HOSPITAL LAB): SARS Coronavirus 2: NEGATIVE

## 2018-12-16 SURGERY — EGD (ESOPHAGOGASTRODUODENOSCOPY)
Anesthesia: Moderate Sedation

## 2018-12-16 MED ORDER — LIDOCAINE VISCOUS HCL 2 % MT SOLN
OROMUCOSAL | Status: DC | PRN
  Administered 2018-12-16: 4 mL via OROMUCOSAL

## 2018-12-16 MED ORDER — PROMETHAZINE HCL 25 MG/ML IJ SOLN
INTRAMUSCULAR | Status: AC
  Filled 2018-12-16: qty 1

## 2018-12-16 MED ORDER — MEPERIDINE HCL 100 MG/ML IJ SOLN
INTRAMUSCULAR | Status: AC
  Filled 2018-12-16: qty 2

## 2018-12-16 MED ORDER — SODIUM CHLORIDE 0.9% FLUSH
INTRAVENOUS | Status: AC
  Filled 2018-12-16: qty 10

## 2018-12-16 MED ORDER — MIDAZOLAM HCL 5 MG/5ML IJ SOLN
INTRAMUSCULAR | Status: AC
  Filled 2018-12-16: qty 10

## 2018-12-16 MED ORDER — PROMETHAZINE HCL 25 MG/ML IJ SOLN
INTRAMUSCULAR | Status: DC | PRN
  Administered 2018-12-16: 6.25 mg via INTRAVENOUS

## 2018-12-16 MED ORDER — PANTOPRAZOLE SODIUM 40 MG PO TBEC: DELAYED_RELEASE_TABLET | ORAL | 11 refills | Status: DC

## 2018-12-16 MED ORDER — LIDOCAINE VISCOUS HCL 2 % MT SOLN
OROMUCOSAL | Status: AC
  Filled 2018-12-16: qty 15

## 2018-12-16 MED ORDER — STERILE WATER FOR IRRIGATION IR SOLN
Status: DC | PRN
  Administered 2018-12-16: 2.5 mL

## 2018-12-16 MED ORDER — MIDAZOLAM HCL 5 MG/5ML IJ SOLN
INTRAMUSCULAR | Status: DC | PRN
  Administered 2018-12-16: 1 mg via INTRAVENOUS
  Administered 2018-12-16 (×2): 2 mg via INTRAVENOUS
  Administered 2018-12-16: 1 mg via INTRAVENOUS
  Administered 2018-12-16: 2 mg via INTRAVENOUS

## 2018-12-16 MED ORDER — MINERAL OIL PO OIL
TOPICAL_OIL | ORAL | Status: AC
  Filled 2018-12-16: qty 30

## 2018-12-16 MED ORDER — SIMETHICONE 40 MG/0.6ML PO SUSP
ORAL | Status: AC
  Filled 2018-12-16: qty 0.6

## 2018-12-16 MED ORDER — MEPERIDINE HCL 100 MG/ML IJ SOLN
INTRAMUSCULAR | Status: DC | PRN
  Administered 2018-12-16 (×4): 25 mg via INTRAVENOUS

## 2018-12-16 MED ORDER — SODIUM CHLORIDE 0.9 % IV SOLN
INTRAVENOUS | Status: DC
  Administered 2018-12-16: 12:00:00 via INTRAVENOUS

## 2018-12-16 NOTE — ED Triage Notes (Signed)
Patient presents to the ED after eating breakfast of yogurt with fruit and granola when patient feels "something still stuck in throat".  Patient stated she vomited once without relief.  Patient has history of GERD.
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
## 2018-12-16 NOTE — Discharge Instructions (Signed)
THE FOOD BOLUS HAD ALREADY PASSED.  You had a stricture near the base of your esophagus. I dilated your esophagus. You have mild gastritis. I biopsied your ESOPHAGUS AND stomach.   DRINK WATER TO KEEP YOUR URINE LIGHT YELLOW.  AVOID REFLUX TRIGGERS. SEE INFO BELOW.  STRICTLY FOLLOW A LOW FAT DIET. SEE INFO BELOW.    TO TREAT REFLUX AND AND GASTRITIS, START PROTONIX.  TAKE 30 MINUTES PRIOR TO YOUR FIRST MEAL DAILY.   YOUR BIOPSY RESULTS WILL BE BACK IN 5 BUSINESS DAYS.   FOLLOW UP IN 4 MOS.  PLEASE CALL DR. FIELDS OFFICE [phone] TO SCHEDULE AN APPT.  UPPER ENDOSCOPY AFTER CARE Read the instructions outlined below and refer to this sheet in the next week. These discharge instructions provide you with general information on caring for yourself after you leave the hospital. While your treatment has been planned according to the most current medical practices available, unavoidable complications occasionally occur. If you have any problems or questions after discharge, call DR. FIELDS, [phone].  ACTIVITY  You may resume your regular activity, but move at a slower pace for the next 24 hours.   Take frequent rest periods for the next 24 hours.   Walking will help get rid of the air and reduce the bloated feeling in your belly (abdomen).   No driving for 24 hours (because of the medicine (anesthesia) used during the test).   You may shower.   Do not sign any important legal documents or operate any machinery for 24 hours (because of the anesthesia used during the test).    NUTRITION  Drink plenty of fluids.   You may resume your normal diet as instructed by your doctor.   Begin with a light meal and progress to your normal diet. Heavy or fried foods are harder to digest and may make you feel sick to your stomach (nauseated).   Avoid alcoholic beverages for 24 hours or as instructed.    MEDICATIONS  You may resume your normal medications.   WHAT YOU CAN EXPECT  TODAY  Some feelings of bloating in the abdomen.   Passage of more gas than usual.    IF YOU HAD A BIOPSY TAKEN DURING THE UPPER ENDOSCOPY:  Eat a soft diet IF YOU HAVE NAUSEA, BLOATING, ABDOMINAL PAIN, OR VOMITING.    FINDING OUT THE RESULTS OF YOUR TEST Not all test results are available during your visit. DR. Oneida Alar WILL CALL YOU WITHIN 14 DAYS OF YOUR PROCEDUE WITH YOUR RESULTS. Do not assume everything is normal if you have not heard from DR. FIELDS, CALL HER OFFICE AT [phone].  SEEK IMMEDIATE MEDICAL ATTENTION AND CALL THE OFFICE: [phone] IF:  You have more than a spotting of blood in your stool.   Your belly is swollen (abdominal distention).   You are nauseated or vomiting.   You have a temperature over 101F.   You have abdominal pain or discomfort that is severe or gets worse throughout the day.  Gastritis  Gastritis is an inflammation (the body's way of reacting to injury and/or infection) of the stomach. It is often caused by viral or bacterial (germ) infections. It can also be caused BY ASPIRIN, BC/GOODY POWDER'S, (IBUPROFEN) MOTRIN, OR ALEVE (NAPROXEN), chemicals (including alcohol), SPICY FOODS, and medications. This illness may be associated with generalized malaise (feeling tired, not well), UPPER ABDOMINAL STOMACH cramps, and fever. One common bacterial cause of gastritis is an organism known as H. Pylori. This can be treated with antibiotics.  ESOPHAGEAL STRICTURE  Esophageal strictures can be caused by stomach acid backing up into the tube that carries food from the mouth down to the stomach (lower esophagus).  TREATMENT There are a number of  medicines used to treat reflux/stricture, including: Antacids.  Proton-pump inhibitors: PANTOPRAZOLE  PEPCID OR TAGAMET     Lifestyle and home remedies TO CONTROL REFLUX/HEARTBURN You may eliminate or reduce the frequency of heartburn by making the following lifestyle changes:   Control your weight.  Being overweight is a major risk factor for heartburn and GERD. Excess pounds put pressure on your abdomen, pushing up your stomach and causing acid to back up into your esophagus.    Eat smaller meals. 4 TO 6 MEALS A DAY. This reduces pressure on the lower esophageal sphincter, helping to prevent the valve from opening and acid from washing back into your esophagus.    Loosen your belt. Clothes that fit tightly around your waist put pressure on your abdomen and the lower esophageal sphincter.    Eliminate heartburn triggers. Everyone has specific triggers. Common triggers such as fatty or fried foods, spicy food, tomato sauce, carbonated beverages, alcohol, chocolate, mint, garlic, onion, caffeine and nicotine may make heartburn worse.    Avoid stooping or bending. Tying your shoes is OK. Bending over for longer periods to weed your garden isn't, especially soon after eating.    Don't lie down after a meal. Wait at least three to four hours after eating before going to bed, and don't lie down right after eating.    Alternative medicine  Several home remedies exist for treating GERD, but they provide only temporary relief. They include drinking baking soda (sodium bicarbonate) added to water or drinking other fluids such as baking soda mixed with cream of tartar and water.   Although these liquids create temporary relief by neutralizing, washing away or buffering acids, eventually they aggravate the situation by adding gas and fluid to your stomach, increasing pressure and causing more acid reflux. Further, adding more sodium to your diet may increase your blood pressure and add stress to your heart, and excessive bicarbonate ingestion can alter the acid-base balance in your body.   Low-Fat Diet  BREADS, CEREALS, PASTA, RICE, DRIED PEAS, AND BEANS  These products are high in carbohydrates and most are low in fat. Therefore, they can be increased in the diet as substitutes for fatty  foods. They too, however, contain calories and should not be eaten in excess. Cereals can be eaten for snacks as well as for breakfast.   Include foods that contain fiber (fruits, vegetables, whole grains, and legumes). Research shows that fiber may lower blood cholesterol levels, especially the water-soluble fiber found in fruits, vegetables, oat products, and legumes.  FRUITS AND VEGETABLES  It is good to eat fruits and vegetables. Besides being sources of fiber, both are rich in vitamins and some minerals. They help you get the daily allowances of these nutrients. Fruits and vegetables can be used for snacks and desserts.  MEATS  Limit lean meat, chicken, [place], and fish to no more than 6 ounces per day.  Beef, Pork, and Lamb  Use lean cuts of beef, pork, and lamb. Lean cuts include:   Extra-lean ground beef.   Arm roast.   Sirloin tip.   Center-cut ham.   Round steak.   Loin chops.   Rump roast.   Tenderloin.   Trim all fat off the outside of meats before cooking. It is not necessary to severely  decrease the intake of red meat, but lean choices should be made. Lean meat is rich in protein and contains a highly absorbable form of iron. Premenopausal women, in particular, should avoid reducing lean red meat because this could increase the risk for low red blood cells (iron-deficiency anemia).  Processed Meats  Processed meats, such as bacon, bologna, salami, sausage, and hot dogs contain large quantities of fat, are not rich in valuable nutrients, and should not be eaten very often.  Organ Meats  The organ meats, such as liver, sweetbreads, kidneys, and brain are very rich in cholesterol. They should be limited.  Chicken and [place]  These are good sources of protein. The fat of poultry can be reduced by removing the skin and underlying fat layers before cooking. Chicken and [place] can be substituted for lean red meat in the diet. Poultry should not be fried or covered  with high-fat sauces.  Fish and Shellfish  Fish is a good source of protein. Shellfish contain cholesterol, but they usually are low in saturated fatty acids. The preparation of fish is important. Like chicken and [place], they should not be fried or covered with high-fat sauces.  EGGS  Egg yolks often are hidden in cooked and processed foods. Egg whites contain no fat or cholesterol. They can be eaten often. Try 1 to 2 egg whites instead of whole eggs in recipes or use egg substitutes that do not contain yolk.  MILK AND DAIRY PRODUCTS  Use skim or 1% milk instead of 2% or whole milk. Decrease whole milk, natural, and processed cheeses. Use nonfat or low-fat (2%) cottage cheese or low-fat cheeses made from vegetable oils. Choose nonfat or low-fat (1 to 2%) yogurt. Experiment with evaporated skim milk in recipes that call for heavy cream. Substitute low-fat yogurt or low-fat cottage cheese for sour cream in dips and salad dressings. Have at least 2 servings of low-fat dairy products, such as 2 glasses of skim (or 1%) milk each day to help get your daily calcium intake.  FATS AND OILS  Reduce the total intake of fats, especially saturated fat. Butterfat, lard, and beef fats are high in saturated fat and cholesterol. These should be avoided as much as possible. Vegetable fats do not contain cholesterol, but certain vegetable fats, such as coconut oil, palm oil, and palm kernel oil are very high in saturated fats. These should be limited. These fats are often used in bakery goods, processed foods, popcorn, oils, and nondairy creamers. Vegetable shortenings and some peanut butters contain hydrogenated oils, which are also saturated fats. Read the labels on these foods and check for saturated vegetable oils.  Unsaturated vegetable oils and fats do not raise blood cholesterol. However, they should be limited because they are fats and are high in calories. Total fat should still be limited to 30% of your  daily caloric intake. Desirable liquid vegetable oils are corn oil, cottonseed oil, olive oil, canola oil, safflower oil, soybean oil, and sunflower oil. Peanut oil is not as good, but small amounts are acceptable. Buy a heart-healthy tub margarine that has no partially hydrogenated oils in the ingredients. Mayonnaise and salad dressings often are made from unsaturated fats, but they should also be limited because of their high calorie and fat content.  Seeds, nuts, peanut butter, olives, and avocados are high in fat, but the fat is mainly the unsaturated type. These foods should be limited mainly to avoid excess calories and fat.  OTHER EATING TIPS  Snacks  Most sweets should be limited as snacks. They tend to be rich in calories and fats, and their caloric content outweighs their nutritional value. Some good choices in snacks are graham crackers, melba toast, soda crackers, bagels (no egg), English muffins, fruits, and vegetables. These snacks are preferable to snack crackers, [place] fries, and chips. Popcorn should be air-popped or cooked in small amounts of liquid vegetable oil.  Desserts  Eat fruit, low-fat yogurt, and fruit ices instead of pastries, cake, and cookies. Sherbet, angel food cake, gelatin dessert, frozen low-fat yogurt, or other frozen products that do not contain saturated fat (pure fruit juice bars, frozen ice pops) are also acceptable.   COOKING METHODS  Choose those methods that use little or no fat. They include:  Poaching.   Braising.   Steaming.   Grilling.   Baking.   Stir-frying.   Broiling.   Microwaving.   Foods can be cooked in a nonstick pan without added fat, or use a nonfat cooking spray in regular cookware. Limit fried foods and avoid frying in saturated fat. Add moisture to lean meats by using water, broth, cooking wines, and other nonfat or low-fat sauces along with the cooking methods mentioned above.  Soups and stews should be chilled after  cooking. The fat that forms on top after a few hours in the refrigerator should be skimmed off. When preparing meals, avoid using excess salt. Salt can contribute to raising blood pressure in some people.  EATING AWAY FROM HOME  Order entres, potatoes, and vegetables without sauces or butter. When meat exceeds the size of a deck of cards (3 to 4 ounces), the rest can be taken home for another meal.  Choose vegetable or fruit salads and ask for low-calorie salad dressings to be served on the side. Use dressings sparingly. Limit high-fat toppings, such as bacon, crumbled eggs, cheese, sunflower seeds, and olives. Ask for heart-healthy tub margarine instead of butter.   PATIENT INSTRUCTIONS POST-ANESTHESIA  IMMEDIATELY FOLLOWING SURGERY:  Do not drive or operate machinery for the first twenty four hours after surgery.  Do not make any important decisions for twenty four hours after surgery or while taking narcotic pain medications or sedatives.  If you develop intractable nausea and vomiting or a severe headache please notify your doctor immediately.  FOLLOW-UP:  Please make an appointment with your surgeon as instructed. You do not need to follow up with anesthesia unless specifically instructed to do so.  WOUND CARE INSTRUCTIONS (if applicable):  Keep a dry clean dressing on the anesthesia/puncture wound site if there is drainage.  Once the wound has quit draining you may leave it open to air.  Generally you should leave the bandage intact for twenty four hours unless there is drainage.  If the epidural site drains for more than 36-48 hours please call the anesthesia department.  QUESTIONS?:  Please feel free to call your physician or the hospital operator if you have any questions, and they will be happy to assist you.

## 2018-12-16 NOTE — Op Note (Signed)
[facility] Patient Name: Kathy Graham Procedure Date: 12/16/2018 12:02 PM MRN: [id number] Date of Birth: 12-Dec-1980 Attending MD: Barney Drain MD, MD CSN: [id number] Age: 38 Admit Type: Outpatient Procedure:                Upper GI endoscopy-COLD FORCEPS BIOPSY, ESOPHAGEAL                            DILATION Indications:              Dysphagia, Foreign body in the esophagus. WANTED                            PROTONIX BECAUSE OMEPRAZOLE MADE HER GAIN 5 LBS.                            HAS HEARTBURN > 3-4 TIMES A WEEK ON PRN ANTACIDS. Providers:                Barney Drain MD, MD, Jeanann Lewandowsky. Sharon Seller, RN,                            Raphael Gibney, Technician Referring MD:             Rosemary Holms, MD Medicines:                Promethazine 6.25 mg IV, Meperidine 100 mg IV,                            Midazolam 8 mg IV Complications:            No immediate complications. Estimated Blood Loss:     Estimated blood loss was minimal. Procedure:                Pre-Anesthesia Assessment:                           - Prior to the procedure, a History and Physical                            was performed, and patient medications and                            allergies were reviewed. The patient's tolerance of                            previous anesthesia was also reviewed. The risks                            and benefits of the procedure and the sedation                            options and risks were discussed with the patient.                            All questions were answered, and informed consent  was obtained. Prior Anticoagulants: The patient has                            taken no previous anticoagulant or antiplatelet                            agents. ASA Grade Assessment: II - A patient with                            mild systemic disease. After reviewing the risks                            and benefits, the patient was deemed in                satisfactory condition to undergo the procedure.                            After obtaining informed consent, the endoscope was                            passed under direct vision. Throughout the                            procedure, the patient's blood pressure, pulse, and                            oxygen saturations were monitored continuously. The                            GIF-[id number] ([id number]) was introduced through the                            mouth, and advanced to the second part of duodenum.                            The upper GI endoscopy was accomplished without                            difficulty. The patient tolerated the procedure                            well. Scope In: 12:28:04 AM Scope Out: 12:43:47 PM Total Procedure Duration: 12 hours 15 minutes 43 seconds  Findings:      A web was found in the distal esophagus. Biopsies were obtained from the       proximal(15 CM FROM THE INCISORS) and distal esophagus(30 CM FROM THE       INCISORS) with cold forceps for histology of suspected eosinophilic       esophagitis. EGJ 35 CM FROM THE INCISORS). A guidewire was placed and       the scope was withdrawn. Dilation was performed with a Savary dilator       with mild resistance at 14 mm and moderate resistance at 15 mm and 16       mm. Estimated blood loss was minimal.  Patchy mild inflammation characterized by congestion (edema) and       erythema was found in the gastric body and in the gastric antrum.       Biopsies were taken with a cold forceps for Helicobacter pylori testing.      The examined duodenum was normal. Impression:               - Web in the distal esophagus DUE TO UNCONTROLLED                            GERD.                           - MILD Gastritis. Biopsied. Moderate Sedation:      Moderate (conscious) sedation was administered by the endoscopy nurse       and supervised by the endoscopist. The following parameters were        monitored: oxygen saturation, heart rate, blood pressure, and response       to care. Total physician intraservice time was 32 minutes. Recommendation:           - Patient has a contact number available for                            emergencies. The signs and symptoms of potential                            delayed complications were discussed with the                            patient. Return to normal activities tomorrow.                            Written discharge instructions were provided to the                            patient.                           - Low fat diet.                           - Continue present medications. START PROTONIX 30                            MINS PRIOR TO BREAKFAST. DISCUSSED WITH THE PT THE                            BENFEITS OF THE PPI OUTWEIGHS THE RISKS. PT SHOULD                            BE ON THE LOWEST MOST EFFECTIVE DOSE.                           - Await pathology results.                           -  Return to my office in 4 months. Procedure Code(s):        --- Professional ---                           [phone], Esophagogastroduodenoscopy, flexible,                            transoral; with insertion of guide wire followed by                            passage of dilator(s) through esophagus over guide                            wire                           43239, 59, Esophagogastroduodenoscopy, flexible,                            transoral; with biopsy, single or multiple                           99153, Moderate sedation; each additional 15                            minutes intraservice time                           G0500, Moderate sedation services provided by the                            same physician or other qualified health care                            professional performing a gastrointestinal                            endoscopic service that sedation supports,                            requiring the presence of an  independent trained                            observer to assist in the monitoring of the                            patient's level of consciousness and physiological                            status; initial 15 minutes of intra-service time;                            patient age 37 years or older (additional time 62  be reported with [phone], as appropriate) Diagnosis Code(s):        --- Professional ---                           Q39.4, Esophageal web                           K29.70, Gastritis, unspecified, without bleeding                           R13.10, Dysphagia, unspecified                           T18.108A, Unspecified foreign body in esophagus                            causing other injury, initial encounter CPT copyright 2019 [employer]. All rights reserved. The codes documented in this report are preliminary and upon coder review may  be revised to meet current compliance requirements. Barney Drain, MD Barney Drain MD, MD 12/16/2018 1:07:02 PM This report has been signed electronically. Number of Addenda: 0

## 2018-12-16 NOTE — Consult Note (Addendum)
Primary Care Physician:  Mikey Kirschner, MD Primary Gastroenterologist:  Dr. Shaaron Adler MD: DR. Noemi Chapel. REASON FOR CONSULTATION: FOOD IMPACTION/GERD  Pre-Procedure History & Physical: HPI:  Kathy Graham is a 38 y.o. female here for FOOD IMPACTION. SHE HAD FOOD IMPACTION/GERD IN THE PAST. DID NOT FOLLOW UP WITH GI. AT BREAKFAST THIS AM AND FELT FOOD GET LODGED IN HER ESOPHAGUS. NOW CANNOT TOLERATE HER OWN SECRETIONS.   Past Medical History:  Diagnosis Date  . Reflux     Past Surgical History:  Procedure Laterality Date  . BACK SURGERY      Prior to Admission medications   Medication Sig Start Date End Date Taking? Authorizing Provider  doxycycline (VIBRAMYCIN) 100 MG capsule Take 1 capsule (100 mg total) by mouth 2 (two) times daily. 06/16/18   Kathyrn Drown, MD  drospirenone-ethinyl estradiol (YAZ,GIANVI,LORYNA) 3-0.02 MG tablet Take 1 tablet by mouth daily.    [provider]  fexofenadine (ALLEGRA) 180 MG tablet Take 180 mg by mouth daily.    [provider]  fluconazole (DIFLUCAN) 150 MG tablet Take 1 tab po, may take second tablet po 3 days later if needed 06/13/18   Cheyenne Adas, NP  Multiple Vitamin (MULTIVITAMIN WITH MINERALS) TABS tablet Take 1 tablet by mouth daily.    [provider]  Oxymetazoline HCl (NASAL DECONGESTANT SPRAY NA) Place 2 puffs into the nose daily.    [provider]    Allergies as of 12/16/2018 - Review Complete 12/16/2018  Allergen Reaction Noted  . Augmentin [amoxicillin-pot clavulanate] Nausea And Vomiting 12/01/2012  . Cefzil [cefprozil]  12/01/2012    Family History  Problem Relation Age of Onset  . Hyperlipidemia Mother   . Colon polyps Mother   . Hypertension Father   . Colon polyps Father   . Cancer Brother   . Colon cancer Other     Social History   Socioeconomic History  . Marital status: Unknown    Spouse name: Not on file  . Number of children: Not on file  . Years  of education: Not on file  . Highest education level: Not on file  Occupational History  . Not on file  Social Needs  . Financial resource strain: Not on file  . Food insecurity    Worry: Not on file    Inability: Not on file  . Transportation needs    Medical: Not on file    Non-medical: Not on file  Tobacco Use  . Smoking status: Never Smoker  . Smokeless tobacco: Never Used  Substance and Sexual Activity  . Alcohol use: Yes    Comment: rare social  . Drug use: No  . Sexual activity: Not Currently    Birth control/protection: Pill  Lifestyle  . Physical activity    Days per week: Not on file    Minutes per session: Not on file  . Stress: Not on file  Relationships  . Social Herbalist on phone: Not on file    Gets together: Not on file    Attends religious service: Not on file    Active member of club or organization: Not on file    Attends meetings of clubs or organizations: Not on file    Relationship status: Not on file  . Intimate partner violence    Fear of current or ex partner: Not on file    Emotionally abused: Not on file    Physically abused: Not on file  Forced sexual activity: Not on file  Other Topics Concern  . Not on file  Social History Narrative  . Not on file    Review of Systems: See HPI, otherwise negative ROS   Physical Exam: BP 133/78 (BP Location: Right Arm)   Pulse 81   Temp 98.1 F (36.7 C)   Resp 14   Ht 5\' 3"  (1.6 m)   Wt 54.4 kg   LMP 07/17/2018 Comment: patient takes quarterly birth control pills   SpO2 98%   BMI 21.26 kg/m  General:   Alert,  pleasant and cooperative in NAD Head:  Normocephalic and atraumatic. Neck:  Supple; Lungs:  Clear throughout to auscultation.    Heart:  Regular rate and rhythm. Abdomen:  Soft, nontender and nondistended. Normal bowel sounds, without guarding, and without rebound.   Neurologic:  Alert and  oriented x4;  grossly normal neurologically.  Impression/Plan:    FOOD  IMPACTION/GERD/DYSPHAGIA  PLAN:  EGD/POSSIBLE DILATION TODAY.  DISCUSSED PROCEDURE, BENEFITS, & RISKS: < 1% chance of medication reaction, bleeding, perforation, or ASPIRATION.

## 2018-12-21 ENCOUNTER — Encounter: Payer: Self-pay | Admitting: Gastroenterology

## 2018-12-21 NOTE — Progress Notes (Signed)
CC'D TO PCP AND APPOINTMENT SCHEDULED

## 2018-12-22 NOTE — Progress Notes (Signed)
Pt is aware.  

## 2018-12-22 NOTE — Progress Notes (Signed)
Patient scheduled.

## 2018-12-27 ENCOUNTER — Encounter (HOSPITAL_COMMUNITY): Payer: Self-pay | Admitting: Gastroenterology

## 2019-01-04 DIAGNOSIS — K9049 Malabsorption due to intolerance, not elsewhere classified: Secondary | ICD-10-CM | POA: Diagnosis not present

## 2019-01-04 DIAGNOSIS — K209 Esophagitis, unspecified: Secondary | ICD-10-CM | POA: Diagnosis not present

## 2019-01-04 DIAGNOSIS — Z713 Dietary counseling and surveillance: Secondary | ICD-10-CM | POA: Diagnosis not present

## 2019-01-05 DIAGNOSIS — D225 Melanocytic nevi of trunk: Secondary | ICD-10-CM | POA: Diagnosis not present

## 2019-01-05 DIAGNOSIS — Z1283 Encounter for screening for malignant neoplasm of skin: Secondary | ICD-10-CM | POA: Diagnosis not present

## 2019-01-16 ENCOUNTER — Other Ambulatory Visit: Payer: Self-pay

## 2019-01-16 ENCOUNTER — Ambulatory Visit
Admission: EM | Admit: 2019-01-16 | Discharge: 2019-01-16 | Disposition: A | Discharge status: Home / Self Care | Payer: BC Managed Care – PPO | Attending: Emergency Medicine | Admitting: Emergency Medicine

## 2019-01-16 DIAGNOSIS — W57XXXA Bitten or stung by nonvenomous insect and other nonvenomous arthropods, initial encounter: Secondary | ICD-10-CM

## 2019-01-16 DIAGNOSIS — M25472 Effusion, left ankle: Secondary | ICD-10-CM

## 2019-01-16 DIAGNOSIS — S90562A Insect bite (nonvenomous), left ankle, initial encounter: Secondary | ICD-10-CM

## 2019-01-16 DIAGNOSIS — Z23 Encounter for immunization: Secondary | ICD-10-CM

## 2019-01-16 MED ORDER — TETANUS-DIPHTH-ACELL PERTUSSIS 5-2.5-18.5 LF-MCG/0.5 IM SUSP
0.5000 mL | Freq: Once | INTRAMUSCULAR | Status: AC
  Administered 2019-01-16: 0.5 mL via INTRAMUSCULAR

## 2019-01-16 MED ORDER — PREDNISONE 20 MG PO TABS: 20.0000 mg | ORAL_TABLET | Freq: Two times a day (BID) | ORAL | 0 refills | Status: AC

## 2019-01-16 NOTE — ED Provider Notes (Signed)
[facility]   [id number] 01/16/19 Arrival Time: 3729  CC: Left ankle swelling and redness  SUBJECTIVE:  Kathy Graham is a 38 y.o. female who presents with a left ankle redness and swelling x 2 days.  Symptoms began after being stung by an unknown insect to her left ankle while cleaning off her deck.  Localizes the swelling to outside of left ankle.  Describes it as itchy, red, and warm to the touch.  Has tried icing and elevation with minimal relief.  Symptoms are made worse to the touch, and with scratching.  Reports similar symptoms in the past that improved without intervention.   Denies fever, chills, nausea, vomiting, discharge, dyspnea, dysphagia, SOB, chest pain, abdominal pain, changes in bowel or bladder function.    ROS: As per HPI.  All other pertinent ROS negative.     Past Medical History:  Diagnosis Date  . Reflux    Past Surgical History:  Procedure Laterality Date  . BACK SURGERY    . BIOPSY  12/16/2018   Procedure: BIOPSY;  Surgeon: Danie Binder, MD;  Location: [facility];  Service: Endoscopy;;  esophagus gastric   . ESOPHAGOGASTRODUODENOSCOPY N/A 12/16/2018   Procedure: ESOPHAGOGASTRODUODENOSCOPY (EGD);  Surgeon: Danie Binder, MD;  Location: [facility];  Service: Endoscopy;  Laterality: N/A;   Allergies  Allergen Reactions  . Augmentin [Amoxicillin-Pot Clavulanate] Nausea And Vomiting  . Cefzil [Cefprozil]     Serum sickness   No current facility-administered medications on file prior to encounter.    Current Outpatient Medications on File Prior to Encounter  Medication Sig Dispense Refill  . drospirenone-ethinyl estradiol (YAZ,GIANVI,LORYNA) 3-0.02 MG tablet Take 1 tablet by mouth daily.    . fexofenadine (ALLEGRA) 180 MG tablet Take 180 mg by mouth daily.    . hydrocortisone (ANUSOL-HC) 2.5 % rectal cream Place 1 application rectally daily as needed.    . Multiple Vitamin (MULTIVITAMIN WITH MINERALS) TABS tablet Take 1 tablet by  mouth daily.    . NONFORMULARY OR COMPOUNDED ITEM Place 1 capsule vaginally at bedtime as needed. COMPOUNDED ITEM: 600mg  Boric Acid Vaginal Suppositories  Insert 1 suppository vaginally at bedtime as needed - do NOT take orally(Toxic)    . Oxymetazoline HCl (NASAL DECONGESTANT SPRAY NA) Place 2 puffs into the nose daily.    . pantoprazole (PROTONIX) 40 MG tablet 1 po 30 mins prior to first meal 30 tablet 11   Social History   Socioeconomic History  . Marital status: Unknown    Spouse name: Not on file  . Number of children: Not on file  . Years of education: Not on file  . Highest education level: Not on file  Occupational History  . Not on file  Social Needs  . Financial resource strain: Not on file  . Food insecurity    Worry: Not on file    Inability: Not on file  . Transportation needs    Medical: Not on file    Non-medical: Not on file  Tobacco Use  . Smoking status: Never Smoker  . Smokeless tobacco: Never Used  Substance and Sexual Activity  . Alcohol use: Yes    Comment: rare social  . Drug use: No  . Sexual activity: Not Currently    Birth control/protection: Pill  Lifestyle  . Physical activity    Days per week: Not on file    Minutes per session: Not on file  . Stress: Not on file  Relationships  . Social connections  Talks on phone: Not on file    Gets together: Not on file    Attends religious service: Not on file    Active member of club or organization: Not on file    Attends meetings of clubs or organizations: Not on file    Relationship status: Not on file  . Intimate partner violence    Fear of current or ex partner: Not on file    Emotionally abused: Not on file    Physically abused: Not on file    Forced sexual activity: Not on file  Other Topics Concern  . Not on file  Social History Narrative  . Not on file   Family History  Problem Relation Age of Onset  . Hyperlipidemia Mother   . Colon polyps Mother   . Hypertension Father   .  Colon polyps Father   . Cancer Brother   . Colon cancer Other     OBJECTIVE: Vitals:   01/16/19 1625  BP: 105/69  Pulse: 80  Resp: 18  Temp: 98.4 F (36.9 C)  SpO2: 99%    General appearance: alert; no distress Head: NCAT Lungs: normal respiratory effort Heart: Dorsalis pedis pulse 2+.  Cap refill < 2 secs Extremities: no edema; left lateral malleolus with swelling and erythema, warm to the touch, NTTP, no obvious drainage, bleeding or wound Skin: warm and dry Psychological: alert and cooperative; normal mood and affect  ASSESSMENT & PLAN:  1. Insect bite of left ankle, initial encounter   2. Left ankle swelling     Meds ordered this encounter  Medications  . Tdap (BOOSTRIX) injection 0.5 mL  . predniSONE (DELTASONE) 20 MG tablet    Sig: Take 1 tablet (20 mg total) by mouth 2 (two) times daily with a meal for 5 days.    Dispense:  10 tablet    Refill:  0    Order Specific Question:   Supervising Provider    Answer:   Raylene Everts [[id number]]   Tetanus updated Wash site with warm water and mild soap Elevate limb and apply ice Prednisone prescribed.  Take as directed and to completion Use OTC antihistamine during the day, and benadryl at night Return or follow up with PCP if symptoms persists Return or go to the ER if you have any new or worsening symptoms such as fever, chills, nausea, vomiting, redness, swelling, discharge, if symptoms do not improve with medications, etc...  Reviewed expectations re: course of current medical issues. Questions answered. Outlined signs and symptoms indicating need for more acute intervention. Patient verbalized understanding. After Visit Summary given.   Lestine Box, PA-C 01/16/19 1653

## 2019-01-16 NOTE — Discharge Instructions (Signed)
Tetanus updated Wash site with warm water and mild soap Elevate limb and apply ice Prednisone prescribed.  Take as directed and to completion Use OTC antihistamine during the day, and benadryl at night Return or follow up with PCP if symptoms persists Return or go to the ER if you have any new or worsening symptoms such as fever, chills, nausea, vomiting, redness, swelling, discharge, if symptoms do not improve with medications, etc..Marland Kitchen

## 2019-01-16 NOTE — ED Triage Notes (Signed)
Pt presents with complaints of being stung by a bee Saturday on her left ankle. States that it has been very itchy and now her toes are starting to swell up some. Pt denies any other symptoms

## 2019-02-01 DIAGNOSIS — Z8719 Personal history of other diseases of the digestive system: Secondary | ICD-10-CM | POA: Diagnosis not present

## 2019-02-01 DIAGNOSIS — Z713 Dietary counseling and surveillance: Secondary | ICD-10-CM | POA: Diagnosis not present

## 2019-02-01 DIAGNOSIS — K9049 Malabsorption due to intolerance, not elsewhere classified: Secondary | ICD-10-CM | POA: Diagnosis not present

## 2019-04-05 DIAGNOSIS — Z681 Body mass index (BMI) 19 or less, adult: Secondary | ICD-10-CM | POA: Diagnosis not present

## 2019-04-05 DIAGNOSIS — N898 Other specified noninflammatory disorders of vagina: Secondary | ICD-10-CM | POA: Diagnosis not present

## 2019-04-05 DIAGNOSIS — Z1231 Encounter for screening mammogram for malignant neoplasm of breast: Secondary | ICD-10-CM | POA: Diagnosis not present

## 2019-04-05 DIAGNOSIS — Z01419 Encounter for gynecological examination (general) (routine) without abnormal findings: Secondary | ICD-10-CM | POA: Diagnosis not present

## 2019-04-07 ENCOUNTER — Other Ambulatory Visit: Payer: Self-pay | Admitting: Obstetrics

## 2019-04-07 DIAGNOSIS — R928 Other abnormal and inconclusive findings on diagnostic imaging of breast: Secondary | ICD-10-CM

## 2019-04-10 ENCOUNTER — Encounter: Payer: Self-pay | Admitting: Gastroenterology

## 2019-04-12 ENCOUNTER — Ambulatory Visit
Admission: RE | Admit: 2019-04-12 | Discharge: 2019-04-12 | Disposition: A | Discharge status: Home / Self Care | Payer: BC Managed Care – PPO | Source: Ambulatory Visit | Attending: Obstetrics | Admitting: Obstetrics

## 2019-04-12 ENCOUNTER — Other Ambulatory Visit: Payer: Self-pay

## 2019-04-12 DIAGNOSIS — R928 Other abnormal and inconclusive findings on diagnostic imaging of breast: Secondary | ICD-10-CM

## 2019-04-12 DIAGNOSIS — R922 Inconclusive mammogram: Secondary | ICD-10-CM | POA: Diagnosis not present

## 2019-04-12 DIAGNOSIS — N6011 Diffuse cystic mastopathy of right breast: Secondary | ICD-10-CM | POA: Diagnosis not present

## 2019-04-19 DIAGNOSIS — K222 Esophageal obstruction: Secondary | ICD-10-CM | POA: Diagnosis not present

## 2019-04-19 DIAGNOSIS — K219 Gastro-esophageal reflux disease without esophagitis: Secondary | ICD-10-CM | POA: Diagnosis not present

## 2019-04-19 DIAGNOSIS — K9049 Malabsorption due to intolerance, not elsewhere classified: Secondary | ICD-10-CM | POA: Diagnosis not present

## 2019-04-19 DIAGNOSIS — Z713 Dietary counseling and surveillance: Secondary | ICD-10-CM | POA: Diagnosis not present

## 2019-04-25 ENCOUNTER — Ambulatory Visit: Payer: BC Managed Care – PPO | Admitting: Gastroenterology

## 2019-04-28 ENCOUNTER — Ambulatory Visit: Payer: BC Managed Care – PPO | Admitting: Gastroenterology

## 2019-04-28 ENCOUNTER — Other Ambulatory Visit: Payer: Self-pay

## 2019-04-28 ENCOUNTER — Encounter: Payer: Self-pay | Admitting: Gastroenterology

## 2019-04-28 DIAGNOSIS — K219 Gastro-esophageal reflux disease without esophagitis: Secondary | ICD-10-CM

## 2019-04-28 DIAGNOSIS — Z83719 Family history of colon polyps, unspecified: Secondary | ICD-10-CM | POA: Insufficient documentation

## 2019-04-28 DIAGNOSIS — Z8371 Family history of colonic polyps: Secondary | ICD-10-CM | POA: Diagnosis not present

## 2019-04-28 MED ORDER — PANTOPRAZOLE SODIUM 40 MG PO TBEC: 40.0000 mg | DELAYED_RELEASE_TABLET | Freq: Every day | ORAL | 3 refills | Status: DC

## 2019-04-28 NOTE — Patient Instructions (Addendum)
1. RX sent to [facility] for pantoprazole. 2. Colonoscopy as scheduled. See separate instructions. You will need to maintain colonoscopies every five year due to your family history of colon polyps in both parents at early age.

## 2019-04-28 NOTE — Assessment & Plan Note (Signed)
Family history of adenomatous colon polyps, both parents at age less than 31.  Patient is due for high risk screening colonoscopy. I have discussed the risks, alternatives, benefits with regards to but not limited to the risk of reaction to medication, bleeding, infection, perforation and the patient is agreeable to proceed. Written consent to be obtained.

## 2019-04-28 NOTE — Progress Notes (Signed)
Primary Care Physician: Mikey Kirschner, MD  Primary Gastroenterologist:    Chief Complaint  Patient presents with  . Follow-up    pp f/u; doing ok    HPI: Kathy Graham is a 38 y.o. female here for follow-up.  She was seen for a food impaction back in June 2020. EGD at that time showed web in the distal esophagus status post dilation, esophageal biopsies consistent with GERD, gastric biopsies consistent with gastritis.  No H. Pylori.  She we last saw her she went for allergy testing. She is allergic to 27 different foods. She is feeling much better since eliminating these foods from her diet. Feels much better with elimination. Swallowing has been better. Heartburn better. She lost about 8 pounds with dietary changes but this has leveled off. Her BMs are regular. No melena, brbpr. No abdominal pain. She is taking pantoprazole about every other day.   Both parents had colon polyps before age of 28. They were told that their children need to have colonoscopy starting at age 21. They have colonoscopy every five years. No FH of CRC.   Patient has never had a colonoscopy.    Current Outpatient Medications  Medication Sig Dispense Refill  . drospirenone-ethinyl estradiol (YAZ,GIANVI,LORYNA) 3-0.02 MG tablet Take 1 tablet by mouth daily.    . fexofenadine (ALLEGRA) 180 MG tablet Take 180 mg by mouth daily.    Marland Kitchen MAGNESIUM GLYCINATE PO Take by mouth daily.    . Multiple Vitamin (MULTIVITAMIN WITH MINERALS) TABS tablet Take 1 tablet by mouth daily.    . Oxymetazoline HCl (NASAL DECONGESTANT SPRAY NA) Place 2 puffs into the nose as needed.     . pantoprazole (PROTONIX) 40 MG tablet 1 po 30 mins prior to first meal 30 tablet 11   No current facility-administered medications for this visit.     Allergies as of 04/28/2019 - Review Complete 04/28/2019  Allergen Reaction Noted  . Augmentin [amoxicillin-pot clavulanate] Nausea And Vomiting 12/01/2012  . Cefzil [cefprozil]  12/01/2012    Past Medical History:  Diagnosis Date  . Reflux    Past Surgical History:  Procedure Laterality Date  . BACK SURGERY    . BIOPSY  12/16/2018   Procedure: BIOPSY;  Surgeon: Danie Binder, MD;  Location: [facility];  Service: Endoscopy;;  esophagus gastric   . ESOPHAGOGASTRODUODENOSCOPY N/A 12/16/2018   Dr. Oneida Alar: Patchy mild inflammation characterized by congestion and erythema in the gastric body and gastric antrum.  Web found in the distal esophagus status post dilation.  Gastric biopsies showed gastritis but no H. pylori.  Esophageal biopsy showed reflux.   Family History  Problem Relation Age of Onset  . Hyperlipidemia Mother   . Colon polyps Mother        before age 40  . Hypertension Father   . Colon polyps Father        before age 13  . Cancer Brother 54       melanoma  . Colon cancer Other    Social History   Tobacco Use  . Smoking status: Never Smoker  . Smokeless tobacco: Never Used  Substance Use Topics  . Alcohol use: Yes    Comment: occ glass of wine  . Drug use: No    ROS:  General: Negative for anorexia, weight loss, fever, chills, fatigue, weakness. ENT: Negative for hoarseness, difficulty swallowing , nasal congestion. CV: Negative for chest pain, angina, palpitations, dyspnea on exertion, peripheral edema.  Respiratory:  Negative for dyspnea at rest, dyspnea on exertion, cough, sputum, wheezing.  GI: See history of present illness. GU:  Negative for dysuria, hematuria, urinary incontinence, urinary frequency, nocturnal urination.  Endo: Negative for unusual weight change.    Physical Examination:   BP 109/68   Pulse 68   Temp (!) 96.8 F (36 C) (Temporal)   Ht 5\' 3"  (1.6 m)   Wt 116 lb (52.6 kg)   LMP 12/26/2018 (Approximate) Comment: takes birth control  BMI 20.55 kg/m   General: Well-nourished, well-developed in no acute distress.  Eyes: No icterus. Mouth: Oropharyngeal mucosa moist and pink , no lesions erythema or exudate.  Lungs: Clear to auscultation bilaterally.  Heart: Regular rate and rhythm, no murmurs rubs or gallops.  Abdomen: Bowel sounds are normal, nontender, nondistended, no hepatosplenomegaly or masses, no abdominal bruits or hernia , no rebound or guarding.   Extremities: No lower extremity edema. No clubbing or deformities. Neuro: Alert and oriented x 4   Skin: Warm and dry, no jaundice.   Psych: Alert and cooperative, normal mood and affect.  Imaging Studies: US Breast Ltd Uni Right Inc Axilla  Result Date: 04/12/2019 CLINICAL DATA:  Patient was called back from screening mammogram for a possible mass in the right breast. EXAM: DIGITAL DIAGNOSTIC RIGHT MAMMOGRAM WITH TOMO ULTRASOUND RIGHT BREAST COMPARISON:  Previous exam(s). ACR Breast Density Category d: The breast tissue is extremely dense, which lowers the sensitivity of mammography. FINDINGS: Additional imaging of the right breast was performed. The obscured mass in the medial aspect of the right breast seen on the screening mammogram is less apparent on the spot compression views. Targeted ultrasound is performed, showing 2 well-circumscribed anechoic cysts in the right breast at 2 o'clock 1 cm from the nipple measuring 5 x 5 x 5 mm and 3 x 4 x 4 mm. No solid mass or abnormal shadowing detected. IMPRESSION: Right breast cysts.  No evidence of malignancy in the right breast. RECOMMENDATION: Bilateral screening mammogram in 1 year is recommended. I have discussed the findings and recommendations with the patient. If applicable, a reminder letter will be sent to the patient regarding the next appointment. BI-RADS CATEGORY  2: Benign. Electronically Signed   By: Lillia Mountain M.D.   On: 04/12/2019 14:05   Mm Diag Breast Tomo Uni Right  Result Date: 04/12/2019 CLINICAL DATA:  Patient was called back from screening mammogram for a possible mass in the right breast. EXAM: DIGITAL DIAGNOSTIC RIGHT MAMMOGRAM WITH TOMO ULTRASOUND RIGHT BREAST COMPARISON:   Previous exam(s). ACR Breast Density Category d: The breast tissue is extremely dense, which lowers the sensitivity of mammography. FINDINGS: Additional imaging of the right breast was performed. The obscured mass in the medial aspect of the right breast seen on the screening mammogram is less apparent on the spot compression views. Targeted ultrasound is performed, showing 2 well-circumscribed anechoic cysts in the right breast at 2 o'clock 1 cm from the nipple measuring 5 x 5 x 5 mm and 3 x 4 x 4 mm. No solid mass or abnormal shadowing detected. IMPRESSION: Right breast cysts.  No evidence of malignancy in the right breast. RECOMMENDATION: Bilateral screening mammogram in 1 year is recommended. I have discussed the findings and recommendations with the patient. If applicable, a reminder letter will be sent to the patient regarding the next appointment. BI-RADS CATEGORY  2: Benign. Electronically Signed   By: Lillia Mountain M.D.   On: 04/12/2019 14:05

## 2019-04-28 NOTE — Assessment & Plan Note (Signed)
Doing well at this time.  She is eliminated multiple food allergies from her diet.  Her reflux is less of an issue.  No swallowing concerns.  She will continue pantoprazole 40 mg every other day as she is currently doing.  If she starts having frequent breakthrough reflux symptoms she should go back to daily dosing.  Return to the office in 1 year.

## 2019-05-01 ENCOUNTER — Other Ambulatory Visit: Payer: Self-pay | Admitting: *Deleted

## 2019-05-01 ENCOUNTER — Telehealth: Payer: Self-pay | Admitting: *Deleted

## 2019-05-01 DIAGNOSIS — Z8371 Family history of colonic polyps: Secondary | ICD-10-CM

## 2019-05-01 MED ORDER — PEG 3350-KCL-NA BICARB-NACL 420 G PO SOLR: ORAL | 0 refills | Status: DC

## 2019-05-01 NOTE — Telephone Encounter (Signed)
Called pt. She is scheduled for TCS with [facility] 2/26 at 9:30am. Patient aware will mail instructions to her. Rx sent to pharmacy. Orders entered.

## 2019-05-01 NOTE — Progress Notes (Signed)
cc'ed to pcp °

## 2019-07-06 ENCOUNTER — Ambulatory Visit (INDEPENDENT_AMBULATORY_CARE_PROVIDER_SITE_OTHER): Payer: BC Managed Care – PPO | Admitting: Family Medicine

## 2019-07-06 ENCOUNTER — Other Ambulatory Visit: Payer: Self-pay

## 2019-07-06 DIAGNOSIS — R0981 Nasal congestion: Secondary | ICD-10-CM

## 2019-07-06 DIAGNOSIS — Z20822 Contact with and (suspected) exposure to covid-19: Secondary | ICD-10-CM | POA: Diagnosis not present

## 2019-07-06 DIAGNOSIS — J019 Acute sinusitis, unspecified: Secondary | ICD-10-CM

## 2019-07-06 DIAGNOSIS — R519 Headache, unspecified: Secondary | ICD-10-CM

## 2019-07-06 MED ORDER — AMOXICILLIN 500 MG PO CAPS: ORAL_CAPSULE | ORAL | 0 refills | Status: DC

## 2019-07-06 NOTE — Progress Notes (Signed)
   Subjective:  Audio plus video  Patient ID: Kathy Graham, female    DOB: 1980-09-13, 39 y.o.   MRN: [id number]  Sinusitis This is a new problem. Episode onset: last Friday morning. (Sinus pressure under eyes) Treatments tried: Vitamin C, allergy pill, nasocort, gargle with salt water, Tylenol  The treatment provided mild relief.   Virtual Visit via Video Note  I connected with Kathy Graham on 07/06/19 at  4:10 PM EST by a video enabled telemedicine application and verified that I am speaking with the correct person using two identifiers.  Location: Patient: home Provider: office   I discussed the limitations of evaluation and management by telemedicine and the availability of in person appointments. The patient expressed understanding and agreed to proceed.  History of Present Illness:    Observations/Objective:   Assessment and Plan:   Follow Up Instructions:    I discussed the assessment and treatment plan with the patient. The patient was provided an opportunity to ask questions and all were answered. The patient agreed with the plan and demonstrated an understanding of the instructions.   The patient was advised to call back or seek an in-person evaluation if the symptoms worsen or if the condition fails to improve as anticipated.  I provided 20 minutes of non-face-to-face time during this encounter.   Patient notes 5 days of congestion drainage stuffiness.  Progressive into headache.  Frontal in nature.  Some drainage.  Some clearing throat.  No chest pain no shortness of breath no major cough.  Feels like usual sinus infection.  Has had some exposures to others within the extended family    Review of Systems See above    Objective:   Physical Exam  Virtual      Assessment & Plan:  Impression rhinosinusitis is the probable diagnosis.  However with potential exposures and in the midst of the COVID-19 pandemic with fairly high prevalence in the community  definitely recommend testing.  Rationale discussed.  Recommend isolation pending results.  Antibiotics prescribed symptom care discussed

## 2019-07-07 ENCOUNTER — Other Ambulatory Visit: Payer: Self-pay

## 2019-07-07 ENCOUNTER — Ambulatory Visit: Payer: BC Managed Care – PPO | Attending: Internal Medicine

## 2019-07-07 DIAGNOSIS — Z20822 Contact with and (suspected) exposure to covid-19: Secondary | ICD-10-CM | POA: Insufficient documentation

## 2019-07-08 LAB — NOVEL CORONAVIRUS, NAA: SARS-CoV-2, NAA: NOT DETECTED

## 2019-07-24 ENCOUNTER — Encounter: Payer: Self-pay | Admitting: Family Medicine

## 2019-07-26 ENCOUNTER — Ambulatory Visit (INDEPENDENT_AMBULATORY_CARE_PROVIDER_SITE_OTHER): Payer: BC Managed Care – PPO | Admitting: Psychology

## 2019-07-26 DIAGNOSIS — F411 Generalized anxiety disorder: Secondary | ICD-10-CM

## 2019-08-02 ENCOUNTER — Ambulatory Visit (INDEPENDENT_AMBULATORY_CARE_PROVIDER_SITE_OTHER): Payer: BC Managed Care – PPO | Admitting: Psychology

## 2019-08-02 ENCOUNTER — Ambulatory Visit (INDEPENDENT_AMBULATORY_CARE_PROVIDER_SITE_OTHER): Payer: BC Managed Care – PPO | Admitting: Family Medicine

## 2019-08-02 ENCOUNTER — Other Ambulatory Visit: Payer: Self-pay

## 2019-08-02 DIAGNOSIS — F321 Major depressive disorder, single episode, moderate: Secondary | ICD-10-CM

## 2019-08-02 DIAGNOSIS — F411 Generalized anxiety disorder: Secondary | ICD-10-CM | POA: Diagnosis not present

## 2019-08-02 MED ORDER — CLONAZEPAM 1 MG PO TABS: ORAL_TABLET | ORAL | 1 refills | Status: DC

## 2019-08-02 MED ORDER — ESCITALOPRAM OXALATE 10 MG PO TABS: 10.0000 mg | ORAL_TABLET | Freq: Every day | ORAL | 2 refills | Status: DC

## 2019-08-02 NOTE — Progress Notes (Signed)
   Subjective:    Patient ID: Kathy Graham, female    DOB: 04/29/1981, 40 y.o.   MRN: [id number]  HPI  Patient calls to discuss anxiety and mood swings since after Christmas- worse last 2 weeks. Crying a lot. See GAD 7 and PHQ-9  Virtual Visit via Video Note  I connected with Kathy Graham on 08/02/19 at  1:10 PM EST by a video enabled telemedicine application and verified that I am speaking with the correct person using two identifiers.  Location: Patient: home Provider: office   I discussed the limitations of evaluation and management by telemedicine and the availability of in person appointments. The patient expressed understanding and agreed to proceed.  History of Present Illness:    Observations/Objective:   Assessment and Plan:   Follow Up Instructions:    I discussed the assessment and treatment plan with the patient. The patient was provided an opportunity to ask questions and all were answered. The patient agreed with the plan and demonstrated an understanding of the instructions.   The patient was advised to call back or seek an in-person evaluation if the symptoms worsen or if the condition fails to improve as anticipated.  I provided 30 minutes of non-face-to-face time during this encounter.  PHQ-9 reviewed.  GAD-7 reviewed.   See below  Review of Systems No headache no chest pain no shortness of breath    Objective:   Physical Exam   Virtual     Assessment & Plan:  Impression progressive moderate depression with element of anxiety.  Building over recent months.  Certainly stress with Covid a component and discussed with patient.  Also dealing with ongoing challenges of premature death of her brother whom she was quite close to.  Options discussed at length.  Benefits of medications discussed.  Patient has already entered into counseling association.  Will add Lexapro rationale discussed follow-up as scheduled.  Questions answered

## 2019-08-07 ENCOUNTER — Telehealth: Payer: Self-pay | Admitting: Family Medicine

## 2019-08-07 MED ORDER — BUPROPION HCL ER (XL) 150 MG PO TB24: ORAL_TABLET | ORAL | 0 refills | Status: DC

## 2019-08-07 NOTE — Telephone Encounter (Signed)
Please advise. Thank yo

## 2019-08-07 NOTE — Telephone Encounter (Signed)
Pt called to state that she tried taking the Lexapro, states it made her feel shakey, disconnected, very tired, & unable to eat  She did not take it yesterday & is feeling better  Please advise & call pt      [employer]

## 2019-08-07 NOTE — Telephone Encounter (Signed)
Pt contacted and verbalized understanding. Medication sent to pharmacy  

## 2019-08-07 NOTE — Telephone Encounter (Signed)
would rec trying one more agent that is completely different from a pharmocological standpoint. All of our disc last week pointed to someone who would dfiinitley benefit from meds if can tolerate  Wait one week, start wellbutrin xl 150 mg one each morn, starting with quite mild dose, most even sensitive patients can tolerate, thirty days worth, notify us after one week if handling ok

## 2019-08-09 ENCOUNTER — Ambulatory Visit (INDEPENDENT_AMBULATORY_CARE_PROVIDER_SITE_OTHER): Payer: BC Managed Care – PPO | Admitting: Psychology

## 2019-08-09 ENCOUNTER — Telehealth: Payer: Self-pay | Admitting: *Deleted

## 2019-08-09 ENCOUNTER — Encounter: Payer: Self-pay | Admitting: *Deleted

## 2019-08-09 DIAGNOSIS — F411 Generalized anxiety disorder: Secondary | ICD-10-CM | POA: Diagnosis not present

## 2019-08-09 NOTE — Telephone Encounter (Signed)
Pt called in and requested to reschedule her procedure.  She rescheduled to 10/26/2019.  Pt is aware that I will her out new prep instructions and Covid screening information.  Pt voiced understanding.

## 2019-08-16 ENCOUNTER — Other Ambulatory Visit (HOSPITAL_COMMUNITY): Payer: Self-pay

## 2019-08-21 ENCOUNTER — Telehealth: Payer: Self-pay | Admitting: Family Medicine

## 2019-08-21 DIAGNOSIS — Z1331 Encounter for screening for depression: Secondary | ICD-10-CM

## 2019-08-21 DIAGNOSIS — R5383 Other fatigue: Secondary | ICD-10-CM

## 2019-08-21 NOTE — Telephone Encounter (Signed)
Pt has been tired lately and would like to have lab work done.   Thyroid and hormones.   CB# [phone]

## 2019-08-23 ENCOUNTER — Ambulatory Visit (INDEPENDENT_AMBULATORY_CARE_PROVIDER_SITE_OTHER): Payer: BC Managed Care – PPO | Admitting: Psychology

## 2019-08-23 DIAGNOSIS — F411 Generalized anxiety disorder: Secondary | ICD-10-CM | POA: Diagnosis not present

## 2019-08-24 DIAGNOSIS — Z23 Encounter for immunization: Secondary | ICD-10-CM | POA: Diagnosis not present

## 2019-09-01 NOTE — Telephone Encounter (Signed)
Pt last seen 08/02/2019 for depression. Please advise. Thank you

## 2019-09-01 NOTE — Telephone Encounter (Signed)
TSH free T4 CBC met 7, liver, lipid, FSH LH Diagnosis fatigue tiredness depression screening

## 2019-09-01 NOTE — Telephone Encounter (Signed)
Lab orders placed and pt is aware 

## 2019-09-01 NOTE — Telephone Encounter (Signed)
This was sent back 3/1 and wasn't address and patient called today to see if paper work was ready.

## 2019-09-05 DIAGNOSIS — Z13220 Encounter for screening for lipoid disorders: Secondary | ICD-10-CM | POA: Diagnosis not present

## 2019-09-05 DIAGNOSIS — Z1331 Encounter for screening for depression: Secondary | ICD-10-CM | POA: Diagnosis not present

## 2019-09-05 DIAGNOSIS — R5383 Other fatigue: Secondary | ICD-10-CM | POA: Diagnosis not present

## 2019-09-06 ENCOUNTER — Encounter: Payer: Self-pay | Admitting: Family Medicine

## 2019-09-06 ENCOUNTER — Ambulatory Visit (INDEPENDENT_AMBULATORY_CARE_PROVIDER_SITE_OTHER): Payer: BC Managed Care – PPO | Admitting: Psychology

## 2019-09-06 DIAGNOSIS — F411 Generalized anxiety disorder: Secondary | ICD-10-CM

## 2019-09-06 LAB — CBC WITH DIFFERENTIAL/PLATELET
Basophils Absolute: 0.1 10*3/uL (ref 0.0–0.2)
Basos: 1 %
EOS (ABSOLUTE): 0.3 10*3/uL (ref 0.0–0.4)
Eos: 4 %
Hematocrit: 38.3 % (ref 34.0–46.6)
Hemoglobin: 12.5 g/dL (ref 11.1–15.9)
Immature Grans (Abs): 0 10*3/uL (ref 0.0–0.1)
Immature Granulocytes: 0 %
Lymphocytes Absolute: 1.7 10*3/uL (ref 0.7–3.1)
Lymphs: 23 %
MCH: 29.1 pg (ref 26.6–33.0)
MCHC: 32.6 g/dL (ref 31.5–35.7)
MCV: 89 fL (ref 79–97)
Monocytes Absolute: 0.4 10*3/uL (ref 0.1–0.9)
Monocytes: 6 %
Neutrophils Absolute: 4.8 10*3/uL (ref 1.4–7.0)
Neutrophils: 66 %
Platelets: 286 10*3/uL (ref 150–450)
RBC: 4.29 x10E6/uL (ref 3.77–5.28)
RDW: 12.5 % (ref 11.7–15.4)
WBC: 7.4 10*3/uL (ref 3.4–10.8)

## 2019-09-06 LAB — BASIC METABOLIC PANEL
BUN/Creatinine Ratio: 14 (ref 9–23)
BUN: 10 mg/dL (ref 6–24)
CO2: 23 mmol/L (ref 20–29)
Calcium: 9 mg/dL (ref 8.7–10.2)
Chloride: 103 mmol/L (ref 96–106)
Creatinine, Ser: 0.7 mg/dL (ref 0.57–1.00)
GFR calc Af Amer: 124 mL/min/{1.73_m2} (ref >59)
GFR calc non Af Amer: 108 mL/min/{1.73_m2} (ref >59)
Glucose: 81 mg/dL (ref 65–99)
Potassium: 4.6 mmol/L (ref 3.5–5.2)
Sodium: 139 mmol/L (ref 134–144)

## 2019-09-06 LAB — LIPID PANEL
Chol/HDL Ratio: 2.7 ratio (ref 0.0–4.4)
Cholesterol, Total: 194 mg/dL (ref 100–199)
HDL: 72 mg/dL (ref >39)
LDL Chol Calc (NIH): 97 mg/dL (ref 0–99)
Triglycerides: 149 mg/dL (ref 0–149)
VLDL Cholesterol Cal: 25 mg/dL (ref 5–40)

## 2019-09-06 LAB — T4, FREE: Free T4: 1.15 ng/dL (ref 0.82–1.77)

## 2019-09-06 LAB — HEPATIC FUNCTION PANEL
ALT: 15 IU/L (ref 0–32)
AST: 15 IU/L (ref 0–40)
Albumin: 4.1 g/dL (ref 3.8–4.8)
Alkaline Phosphatase: 59 IU/L (ref 39–117)
Bilirubin Total: 0.2 mg/dL (ref 0.0–1.2)
Bilirubin, Direct: 0.07 mg/dL (ref 0.00–0.40)
Total Protein: 6.9 g/dL (ref 6.0–8.5)

## 2019-09-06 LAB — LUTEINIZING HORMONE: LH: <0.3 m[IU]/mL

## 2019-09-06 LAB — FOLLICLE STIMULATING HORMONE: FSH: <0.3 m[IU]/mL

## 2019-09-06 LAB — TSH: TSH: 3.2 u[IU]/mL (ref 0.450–4.500)

## 2019-09-06 NOTE — Telephone Encounter (Signed)
Ok looks like dr scott worked with tanya and got evrything ordered

## 2019-09-06 NOTE — Telephone Encounter (Signed)
Sorry I thought I routed you the results of labs and not the message. Results got sent to dr scott and I forwarded to you

## 2019-09-08 ENCOUNTER — Other Ambulatory Visit: Payer: Self-pay | Admitting: Family Medicine

## 2019-09-08 DIAGNOSIS — R899 Unspecified abnormal finding in specimens from other organs, systems and tissues: Secondary | ICD-10-CM

## 2019-09-13 ENCOUNTER — Encounter: Payer: Self-pay | Admitting: Family Medicine

## 2019-09-14 ENCOUNTER — Telehealth: Payer: Self-pay | Admitting: "Endocrinology

## 2019-09-14 NOTE — Telephone Encounter (Signed)
Dr Dorris Fetch I received referral on this pt. I let Kim review because it says " abnormal test result " neither of [place] see what is abnormal. Do you want me to schedule this? I did lay her labs on your desk as well.

## 2019-09-14 NOTE — Telephone Encounter (Signed)
Based on the labs she recently has , she will not  benefit from seeing me.

## 2019-09-15 NOTE — Telephone Encounter (Signed)
Pt.notified

## 2019-09-20 ENCOUNTER — Ambulatory Visit (INDEPENDENT_AMBULATORY_CARE_PROVIDER_SITE_OTHER): Payer: BC Managed Care – PPO | Admitting: Psychology

## 2019-09-20 DIAGNOSIS — F411 Generalized anxiety disorder: Secondary | ICD-10-CM | POA: Diagnosis not present

## 2019-09-29 DIAGNOSIS — Z23 Encounter for immunization: Secondary | ICD-10-CM | POA: Diagnosis not present

## 2019-10-04 ENCOUNTER — Ambulatory Visit (INDEPENDENT_AMBULATORY_CARE_PROVIDER_SITE_OTHER): Payer: BC Managed Care – PPO | Admitting: Psychology

## 2019-10-04 DIAGNOSIS — F411 Generalized anxiety disorder: Secondary | ICD-10-CM | POA: Diagnosis not present

## 2019-10-05 ENCOUNTER — Other Ambulatory Visit: Payer: Self-pay

## 2019-10-09 ENCOUNTER — Encounter: Payer: Self-pay | Admitting: Endocrinology

## 2019-10-09 ENCOUNTER — Ambulatory Visit: Payer: BC Managed Care – PPO | Admitting: Endocrinology

## 2019-10-09 ENCOUNTER — Other Ambulatory Visit: Payer: Self-pay

## 2019-10-09 DIAGNOSIS — R5383 Other fatigue: Secondary | ICD-10-CM | POA: Diagnosis not present

## 2019-10-09 LAB — CORTISOL
Cortisol, Plasma: 17.2 ug/dL
Cortisol, Plasma: 38 ug/dL

## 2019-10-09 LAB — VITAMIN D 25 HYDROXY (VIT D DEFICIENCY, FRACTURES): VITD: 33.96 ng/mL (ref 30.00–100.00)

## 2019-10-09 MED ORDER — COSYNTROPIN 0.25 MG IJ SOLR
0.2500 mg | Freq: Once | INTRAMUSCULAR | Status: AC
  Administered 2019-10-09: 0.25 mg via INTRAMUSCULAR

## 2019-10-09 NOTE — Progress Notes (Signed)
Subjective:    Patient ID: Kathy Graham, female    DOB: October 02, 1980, 39 y.o.   MRN: [id number]  HPI Pt is ref by Dr Wolfgang Phoenix, for fatigue.  She states 1-2 years of fatigue, and assoc anxiety.  She is unable to cite precip cause.  She has no h/o endocrine disorder.  She also has sxs of cold intolerance, palpitations, dizziness, and fever.   Past Medical History:  Diagnosis Date  . Reflux     Past Surgical History:  Procedure Laterality Date  . BACK SURGERY    . BIOPSY  12/16/2018   Procedure: BIOPSY;  Surgeon: Danie Binder, MD;  Location: [facility];  Service: Endoscopy;;  esophagus gastric   . ESOPHAGOGASTRODUODENOSCOPY N/A 12/16/2018   Dr. Oneida Alar: Patchy mild inflammation characterized by congestion and erythema in the gastric body and gastric antrum.  Web found in the distal esophagus status post dilation.  Gastric biopsies showed gastritis but no H. pylori.  Esophageal biopsy showed reflux.    Social History   Socioeconomic History  . Marital status: Unknown    Spouse name: Not on file  . Number of children: Not on file  . Years of education: Not on file  . Highest education level: Not on file  Occupational History  . Not on file  Tobacco Use  . Smoking status: Never Smoker  . Smokeless tobacco: Never Used  Substance and Sexual Activity  . Alcohol use: Yes    Comment: occ glass of wine  . Drug use: No  . Sexual activity: Not Currently    Birth control/protection: Pill  Other Topics Concern  . Not on file  Social History Narrative  . Not on file   Social Determinants of Health   Financial Resource Strain:   . Difficulty of Paying Living Expenses:   Food Insecurity:   . Worried About Charity fundraiser in the Last Year:   . Arboriculturist in the Last Year:   Transportation Needs:   . Film/video editor (Medical):   Marland Kitchen Lack of Transportation (Non-Medical):   Physical Activity:   . Days of Exercise per Week:   . Minutes of Exercise per Session:     Stress:   . Feeling of Stress :   Social Connections:   . Frequency of Communication with Friends and Family:   . Frequency of Social Gatherings with Friends and Family:   . Attends Religious Services:   . Active Member of Clubs or Organizations:   . Attends Archivist Meetings:   Marland Kitchen Marital Status:   Intimate Partner Violence:   . Fear of Current or Ex-Partner:   . Emotionally Abused:   Marland Kitchen Physically Abused:   . Sexually Abused:     Current Outpatient Medications on File Prior to Visit  Medication Sig Dispense Refill  . clonazePAM (KLONOPIN) 1 MG tablet 1/2 to one at bedtime as needed for sleep 30 tablet 1  . drospirenone-ethinyl estradiol (YAZ,GIANVI,LORYNA) 3-0.02 MG tablet Take 1 tablet by mouth daily.    . fexofenadine (ALLEGRA) 180 MG tablet Take 180 mg by mouth daily.    Marland Kitchen MAGNESIUM GLYCINATE PO Take by mouth daily.    . Multiple Vitamin (MULTIVITAMIN WITH MINERALS) TABS tablet Take 1 tablet by mouth daily.    . Oxymetazoline HCl (NASAL DECONGESTANT SPRAY NA) Place 2 puffs into the nose as needed.     . pantoprazole (PROTONIX) 40 MG tablet Take 1 tablet (40 mg total) by  mouth daily before breakfast. 90 tablet 3   No current facility-administered medications on file prior to visit.    Allergies  Allergen Reactions  . Augmentin [Amoxicillin-Pot Clavulanate] Nausea And Vomiting  . Cefzil [Cefprozil]     Serum sickness    Family History  Problem Relation Age of Onset  . Hyperlipidemia Mother   . Colon polyps Mother        before age 24  . Hypothyroidism Mother   . Hypertension Father   . Colon polyps Father        before age 14  . Cancer Brother 40       melanoma  . Colon cancer Other     BP 104/60   Pulse 68   Ht 5\' 3"  (1.6 m)   Wt 124 lb 9.6 oz (56.5 kg)   SpO2 99%   BMI 22.07 kg/m    Review of Systems denies muscle cramps, weight gain, memory loss, numbness, and dry skin.      Objective:   Physical Exam VS: see vs page GEN: no  distress HEAD: head: no deformity eyes: no periorbital swelling, no proptosis external nose and ears are normal NECK: supple, thyroid is not enlarged CHEST WALL: no deformity LUNGS: clear to auscultation CV: reg rate and rhythm, no murmur.  MUSCULOSKELETAL: muscle bulk and strength are grossly normal.  no obvious joint swelling.  gait is normal and steady EXTEMITIES: no deformity.  no edema PULSES: no carotid bruit NEURO:  cn 2-12 grossly intact.   readily moves all 4's.  sensation is intact to touch on all 4's.  No tremor SKIN:  Normal texture and temperature.  No rash or suspicious lesion is visible.  Not diaphoretic NODES:  None palpable at the neck PSYCH: alert, well-oriented.  Does not appear anxious nor depressed.    Lab Results  Component Value Date   TSH 3.200 09/05/2019   Lab Results  Component Value Date   WBC 7.4 09/05/2019   HGB 12.5 09/05/2019   HCT 38.3 09/05/2019   MCV 89 09/05/2019   PLT 286 09/05/2019   Lab Results  Component Value Date   CREATININE 0.70 09/05/2019   BUN 10 09/05/2019   NA 139 09/05/2019   K 4.6 09/05/2019   CL 103 09/05/2019   CO2 23 09/05/2019   Lab Results  Component Value Date   CALCIUM 9.0 09/05/2019   ACTH stimulation test is done: baseline cortisol level=17 then Cosyntropin 250 mcg is given im 45 minutes later, cortisol level=38 (normal response)  I have reviewed outside records, and summarized: Pt was noted to have fatigue, and referred here.  Main symptoms addressed were anxiety and depression.       Assessment & Plan:  Fatigue, new to me.  Adrenal insuff is excluded as a cause.  Check 25-OH-vit-D.  Patient Instructions  Blood tests are requested for you today.  We'll let you know about the results.  I would be happy to see you back here as needed.

## 2019-10-09 NOTE — Patient Instructions (Signed)
Blood tests are requested for you today.  We'll let you know about the results.  I would be happy to see you back here as needed.

## 2019-10-23 ENCOUNTER — Other Ambulatory Visit (HOSPITAL_COMMUNITY): Payer: Self-pay | Admitting: *Deleted

## 2019-10-23 ENCOUNTER — Telehealth: Payer: Self-pay

## 2019-10-23 NOTE — Telephone Encounter (Signed)
Pt called office, she can't find TCS instructions. Instructions sent to pt via MyChart.

## 2019-10-24 ENCOUNTER — Other Ambulatory Visit: Payer: Self-pay

## 2019-10-24 ENCOUNTER — Other Ambulatory Visit (HOSPITAL_COMMUNITY)
Admission: RE | Admit: 2019-10-24 | Discharge: 2019-10-24 | Disposition: A | Discharge status: Home / Self Care | Payer: BC Managed Care – PPO | Source: Ambulatory Visit | Attending: Gastroenterology | Admitting: Gastroenterology

## 2019-10-24 DIAGNOSIS — Z01812 Encounter for preprocedural laboratory examination: Secondary | ICD-10-CM | POA: Diagnosis not present

## 2019-10-24 DIAGNOSIS — Z808 Family history of malignant neoplasm of other organs or systems: Secondary | ICD-10-CM | POA: Diagnosis not present

## 2019-10-24 DIAGNOSIS — Z1211 Encounter for screening for malignant neoplasm of colon: Secondary | ICD-10-CM | POA: Diagnosis not present

## 2019-10-24 DIAGNOSIS — Q438 Other specified congenital malformations of intestine: Secondary | ICD-10-CM | POA: Diagnosis not present

## 2019-10-24 DIAGNOSIS — Z20822 Contact with and (suspected) exposure to covid-19: Secondary | ICD-10-CM | POA: Insufficient documentation

## 2019-10-24 DIAGNOSIS — Z8249 Family history of ischemic heart disease and other diseases of the circulatory system: Secondary | ICD-10-CM | POA: Diagnosis not present

## 2019-10-24 DIAGNOSIS — Z8719 Personal history of other diseases of the digestive system: Secondary | ICD-10-CM | POA: Diagnosis not present

## 2019-10-24 DIAGNOSIS — Z8349 Family history of other endocrine, nutritional and metabolic diseases: Secondary | ICD-10-CM | POA: Diagnosis not present

## 2019-10-24 DIAGNOSIS — D124 Benign neoplasm of descending colon: Secondary | ICD-10-CM | POA: Diagnosis not present

## 2019-10-24 DIAGNOSIS — Z8371 Family history of colonic polyps: Secondary | ICD-10-CM | POA: Diagnosis not present

## 2019-10-24 DIAGNOSIS — Z79899 Other long term (current) drug therapy: Secondary | ICD-10-CM | POA: Diagnosis not present

## 2019-10-24 DIAGNOSIS — K648 Other hemorrhoids: Secondary | ICD-10-CM | POA: Diagnosis not present

## 2019-10-24 DIAGNOSIS — Z800 Family history of malignant neoplasm of digestive organs: Secondary | ICD-10-CM | POA: Diagnosis not present

## 2019-10-24 DIAGNOSIS — K219 Gastro-esophageal reflux disease without esophagitis: Secondary | ICD-10-CM | POA: Diagnosis not present

## 2019-10-25 ENCOUNTER — Ambulatory Visit (INDEPENDENT_AMBULATORY_CARE_PROVIDER_SITE_OTHER): Payer: BC Managed Care – PPO | Admitting: Psychology

## 2019-10-25 DIAGNOSIS — F411 Generalized anxiety disorder: Secondary | ICD-10-CM | POA: Diagnosis not present

## 2019-10-25 LAB — SARS CORONAVIRUS 2 (TAT 6-24 HRS): SARS Coronavirus 2: NEGATIVE

## 2019-10-26 ENCOUNTER — Ambulatory Visit (HOSPITAL_COMMUNITY)
Admission: RE | Admit: 2019-10-26 | Discharge: 2019-10-26 | Disposition: A | Discharge status: Home / Self Care | Payer: BC Managed Care – PPO | Attending: Gastroenterology | Admitting: Gastroenterology

## 2019-10-26 ENCOUNTER — Other Ambulatory Visit: Payer: Self-pay

## 2019-10-26 ENCOUNTER — Encounter (HOSPITAL_COMMUNITY)
Admission: RE | Disposition: A | Discharge status: Home / Self Care | Payer: Self-pay | Source: Home / Self Care | Attending: Gastroenterology

## 2019-10-26 ENCOUNTER — Encounter (HOSPITAL_COMMUNITY): Payer: Self-pay | Admitting: Gastroenterology

## 2019-10-26 DIAGNOSIS — D124 Benign neoplasm of descending colon: Secondary | ICD-10-CM | POA: Diagnosis not present

## 2019-10-26 DIAGNOSIS — Z800 Family history of malignant neoplasm of digestive organs: Secondary | ICD-10-CM | POA: Diagnosis not present

## 2019-10-26 DIAGNOSIS — Z8719 Personal history of other diseases of the digestive system: Secondary | ICD-10-CM | POA: Diagnosis not present

## 2019-10-26 DIAGNOSIS — Z8349 Family history of other endocrine, nutritional and metabolic diseases: Secondary | ICD-10-CM | POA: Diagnosis not present

## 2019-10-26 DIAGNOSIS — K219 Gastro-esophageal reflux disease without esophagitis: Secondary | ICD-10-CM | POA: Diagnosis not present

## 2019-10-26 DIAGNOSIS — Q438 Other specified congenital malformations of intestine: Secondary | ICD-10-CM | POA: Diagnosis not present

## 2019-10-26 DIAGNOSIS — K648 Other hemorrhoids: Secondary | ICD-10-CM | POA: Insufficient documentation

## 2019-10-26 DIAGNOSIS — Z8371 Family history of colonic polyps: Secondary | ICD-10-CM | POA: Diagnosis not present

## 2019-10-26 DIAGNOSIS — Z79899 Other long term (current) drug therapy: Secondary | ICD-10-CM | POA: Diagnosis not present

## 2019-10-26 DIAGNOSIS — Z1211 Encounter for screening for malignant neoplasm of colon: Secondary | ICD-10-CM

## 2019-10-26 DIAGNOSIS — K635 Polyp of colon: Secondary | ICD-10-CM | POA: Diagnosis not present

## 2019-10-26 DIAGNOSIS — Z808 Family history of malignant neoplasm of other organs or systems: Secondary | ICD-10-CM | POA: Insufficient documentation

## 2019-10-26 DIAGNOSIS — Z8249 Family history of ischemic heart disease and other diseases of the circulatory system: Secondary | ICD-10-CM | POA: Diagnosis not present

## 2019-10-26 HISTORY — PX: COLONOSCOPY: SHX5424

## 2019-10-26 HISTORY — PX: POLYPECTOMY: SHX5525

## 2019-10-26 SURGERY — COLONOSCOPY
Anesthesia: Moderate Sedation

## 2019-10-26 MED ORDER — SODIUM CHLORIDE 0.9 % IV SOLN: INTRAVENOUS | Status: DC

## 2019-10-26 MED ORDER — MIDAZOLAM HCL 5 MG/5ML IJ SOLN
INTRAMUSCULAR | Status: AC
  Filled 2019-10-26: qty 10

## 2019-10-26 MED ORDER — MIDAZOLAM HCL 5 MG/5ML IJ SOLN
INTRAMUSCULAR | Status: DC | PRN
  Administered 2019-10-26: 1 mg via INTRAVENOUS
  Administered 2019-10-26 (×3): 2 mg via INTRAVENOUS

## 2019-10-26 MED ORDER — MEPERIDINE HCL 100 MG/ML IJ SOLN
INTRAMUSCULAR | Status: DC | PRN
  Administered 2019-10-26 (×4): 25 mg via INTRAVENOUS

## 2019-10-26 MED ORDER — STERILE WATER FOR IRRIGATION IR SOLN
Status: DC | PRN
  Administered 2019-10-26: 2.5 mL

## 2019-10-26 MED ORDER — MEPERIDINE HCL 100 MG/ML IJ SOLN
INTRAMUSCULAR | Status: AC
  Filled 2019-10-26: qty 2

## 2019-10-26 NOTE — Discharge Instructions (Signed)
You had 1 small polyp removed. You have SMALL internal hemorrhoids. YOUR APPENDIX OPENING IS NORMAL   DRINK WATER TO KEEP YOUR URINE LIGHT YELLOW.  FOLLOW A HIGH FIBER DIET. AVOID ITEMS THAT CAUSE BLOATING & GAS. SEE INFO BELOW.   YOUR BIOPSY RESULTS WILL BE BACK IN 5 BUSINESS DAYS.  Next colonoscopy in 5-10 years.    Colonoscopy Care After Read the instructions outlined below and refer to this sheet in the next week. These discharge instructions provide you with general information on caring for yourself after you leave the hospital. While your treatment has been planned according to the most current medical practices available, unavoidable complications occasionally occur. If you have any problems or questions after discharge, call DR. Terrika Zuver, [phone].  ACTIVITY  You may resume your regular activity, but move at a slower pace for the next 24 hours.   Take frequent rest periods for the next 24 hours.   Walking will help get rid of the air and reduce the bloated feeling in your belly (abdomen).   No driving for 24 hours (because of the medicine (anesthesia) used during the test).   You may shower.   Do not sign any important legal documents or operate any machinery for 24 hours (because of the anesthesia used during the test).    NUTRITION  Drink plenty of fluids.   You may resume your normal diet as instructed by your doctor.   Begin with a light meal and progress to your normal diet. Heavy or fried foods are harder to digest and may make you feel sick to your stomach (nauseated).   Avoid alcoholic beverages for 24 hours or as instructed.    MEDICATIONS  You may resume your normal medications.   WHAT YOU CAN EXPECT TODAY  Some feelings of bloating in the abdomen.   Passage of more gas than usual.   Spotting of blood in your stool or on the toilet paper  .  IF YOU HAD POLYPS REMOVED DURING THE COLONOSCOPY:  Eat a soft diet IF YOU HAVE NAUSEA, BLOATING,  ABDOMINAL PAIN, OR VOMITING.    FINDING OUT THE RESULTS OF YOUR TEST Not all test results are available during your visit. DR. Oneida Alar WILL CALL YOU WITHIN 14 DAYS OF YOUR PROCEDUE WITH YOUR RESULTS. Do not assume everything is normal if you have not heard from DR. Arath Kaigler, CALL HER OFFICE AT [phone].  SEEK IMMEDIATE MEDICAL ATTENTION AND CALL THE OFFICE: [phone] IF:  You have more than a spotting of blood in your stool.   Your belly is swollen (abdominal distention).   You are nauseated or vomiting.   You have a temperature over 101F.   You have abdominal pain or discomfort that is severe or gets worse throughout the day.   High-Fiber Diet A high-fiber diet changes your normal diet to include more whole grains, legumes, fruits, and vegetables. Changes in the diet involve replacing refined carbohydrates with unrefined foods. The calorie level of the diet is essentially unchanged. The Dietary Reference Intake (recommended amount) for adult males is 38 grams per day. For adult females, it is 25 grams per day. Pregnant and lactating women should consume 28 grams of fiber per day. Fiber is the intact part of a plant that is not broken down during digestion. Functional fiber is fiber that has been isolated from the plant to provide a beneficial effect in the body.  PURPOSE  Increase stool bulk.   Ease and regulate bowel movements.   Lower  cholesterol.   REDUCE RISK OF COLON CANCER  INDICATIONS THAT YOU NEED MORE FIBER  Constipation and hemorrhoids.   Uncomplicated diverticulosis (intestine condition) and irritable bowel syndrome.   Weight management.   As a protective measure against hardening of the arteries (atherosclerosis), diabetes, and cancer.   GUIDELINES FOR INCREASING FIBER IN THE DIET  Start adding fiber to the diet slowly. A gradual increase of about 5 more grams (2 servings of most fruits or vegetables) per day is best. Too rapid an increase in fiber may  result in constipation, flatulence, and bloating.   Drink enough water and fluids to keep your urine clear or pale yellow. Water, juice, or caffeine-free drinks are recommended. Not drinking enough fluid may cause constipation.   Eat a variety of high-fiber foods rather than one type of fiber.   Try to increase your intake of fiber through using high-fiber foods rather than fiber pills or supplements that contain small amounts of fiber.   The goal is to change the types of food eaten. Do not supplement your present diet with high-fiber foods, but replace foods in your present diet.    Polyps, Colon  A polyp is extra tissue that grows inside your body. Colon polyps grow in the large intestine. The large intestine, also called the colon, is part of your digestive system. It is a long, hollow tube at the end of your digestive tract where your body makes and stores stool. Most polyps are not dangerous. They are benign. This means they are not cancerous. But over time, some types of polyps can turn into cancer. Polyps that are smaller than a pea are usually not harmful. But larger polyps could someday become or may already be cancerous. To be safe, doctors remove all polyps and test them.   WHO GETS POLYPS? Anyone can get polyps, but certain people are more likely than others. You may have a greater chance of getting polyps if:  You are over 50.   You have had polyps before.   Someone in your family has had polyps.   Someone in your family has had cancer of the large intestine.   Find out if someone in your family has had polyps. You may also be more likely to get polyps if you:   Eat a lot of fatty foods   Smoke   Drink alcohol   Do not exercise  Eat too much   PREVENTION There is not one sure way to prevent polyps. You might be able to lower your risk of getting them if you:  Eat more fruits and vegetables and less fatty food.   Do not smoke.   Avoid alcohol.   Exercise every  day.   Lose weight if you are overweight.   Eating more calcium and folate can also lower your risk of getting polyps. Some foods that are rich in calcium are milk, cheese, and broccoli. Some foods that are rich in folate are chickpeas, kidney beans, and spinach.

## 2019-10-26 NOTE — H&P (Addendum)
Primary Care Physician:  Mikey Kirschner, MD Primary Gastroenterologist:  Dr. Oneida Alar  Pre-Procedure History & Physical: HPI:  Kathy Graham is a 39 y.o. female here for COLON CANCER SCREENING: 2 primary relatives with > 3 simple adenomas & appendiceal carcinoma.  Past Medical History:  Diagnosis Date  . Reflux     Past Surgical History:  Procedure Laterality Date  . BACK SURGERY    . BIOPSY  12/16/2018   Procedure: BIOPSY;  Surgeon: Danie Binder, MD;  Location: [facility];  Service: Endoscopy;;  esophagus gastric   . ESOPHAGOGASTRODUODENOSCOPY N/A 12/16/2018   Dr. Oneida Alar: Patchy mild inflammation characterized by congestion and erythema in the gastric body and gastric antrum.  Web found in the distal esophagus status post dilation.  Gastric biopsies showed gastritis but no H. pylori.  Esophageal biopsy showed reflux.    Prior to Admission medications   Medication Sig Start Date End Date Taking? Authorizing Provider  clonazePAM (KLONOPIN) 1 MG tablet 1/2 to one at bedtime as needed for sleep Patient taking differently: Take 1 mg by mouth at bedtime as needed (Sleep). 1/2 to one at bedtime as needed for sleep 08/02/19  Yes Mikey Kirschner, MD  drospirenone-ethinyl estradiol Sherrill Raring) 3-0.02 MG tablet Take 1 tablet by mouth daily.   Yes [provider]  fexofenadine (ALLEGRA) 180 MG tablet Take 180 mg by mouth daily.   Yes [provider]  MAGNESIUM GLYCINATE PO Take 400 mg by mouth daily.    Yes [provider]  Multiple Vitamin (MULTIVITAMIN WITH MINERALS) TABS tablet Take 1 tablet by mouth daily.   Yes [provider]  Oxymetazoline HCl (NASAL DECONGESTANT SPRAY NA) Place 2 puffs into the nose daily.    Yes [provider]  pantoprazole (PROTONIX) 40 MG tablet Take 1 tablet (40 mg total) by mouth daily before breakfast. 04/28/19  Yes Mahala Menghini, PA-C    Allergies as of 05/01/2019 - Review Complete 04/28/2019   Allergen Reaction Noted  . Augmentin [amoxicillin-pot clavulanate] Nausea And Vomiting 12/01/2012  . Cefzil [cefprozil]  12/01/2012    Family History  Problem Relation Age of Onset  . Hyperlipidemia Mother   . Colon polyps Mother        before age 1  . Hypothyroidism Mother   . Hypertension Father   . Colon polyps Father        before age 59  . Cancer Brother 71       melanoma  . Colon cancer Other     Social History   Socioeconomic History  . Marital status: Unknown    Spouse name: Not on file  . Number of children: Not on file  . Years of education: Not on file  . Highest education level: Not on file  Occupational History  . Not on file  Tobacco Use  . Smoking status: Never Smoker  . Smokeless tobacco: Never Used  Substance and Sexual Activity  . Alcohol use: Yes    Comment: occ glass of wine  . Drug use: No  . Sexual activity: Not Currently    Birth control/protection: Pill  Other Topics Concern  . Not on file  Social History Narrative  . Not on file   Social Determinants of Health   Financial Resource Strain:   . Difficulty of Paying Living Expenses:   Food Insecurity:   . Worried About Charity fundraiser in the Last Year:   . [place] in the Last  Year:   Transportation Needs:   . Film/video editor (Medical):   Marland Kitchen Lack of Transportation (Non-Medical):   Physical Activity:   . Days of Exercise per Week:   . Minutes of Exercise per Session:   Stress:   . Feeling of Stress :   Social Connections:   . Frequency of Communication with Friends and Family:   . Frequency of Social Gatherings with Friends and Family:   . Attends Religious Services:   . Active Member of Clubs or Organizations:   . Attends Archivist Meetings:   Marland Kitchen Marital Status:   Intimate Partner Violence:   . Fear of Current or Ex-Partner:   . Emotionally Abused:   Marland Kitchen Physically Abused:   . Sexually Abused:     Review of Systems: See HPI, otherwise negative  ROS   Physical Exam: BP 121/62   Pulse 76   Temp 97.7 F (36.5 C) (Oral)   Resp 15   Ht 5\' 3"  (1.6 m)   Wt 53.5 kg   LMP 10/12/2019   SpO2 100%   BMI 20.90 kg/m  General:   Alert,  pleasant and cooperative in NAD Head:  Normocephalic and atraumatic. Neck:  Supple; Lungs:  Clear throughout to auscultation.    Heart:  Regular rate and rhythm. Abdomen:  Soft, nontender and nondistended. Normal bowel sounds, without guarding, and without rebound.   Neurologic:  Alert and  oriented x4;  grossly normal neurologically.  Impression/Plan:    COLON CANCER SCREENING: 2 primary relatives with > 3 simple adenomas & advanced polyps age < 18  Plan:  1. TCS TODAY. DISCUSSED PROCEDURE, BENEFITS, & RISKS: < 1% chance of medication reaction, bleeding, perforation, ASPIRATION, or rupture of spleen/liver requiring surgery to fix it and missed polyps < 1 cm 10-20% of the time.

## 2019-10-26 NOTE — Op Note (Signed)
[facility] Patient Name: Kathy Graham Procedure Date: 10/26/2019 9:52 AM MRN: [id number] Date of Birth: 07-17-1980 Attending MD: Barney Drain MD, MD CSN: [id number] Age: 39 Admit Type: Outpatient Procedure:                ColonoscopyWITH COLD SNARE POLYPECTOMY Indications:              Colon cancer screening in patient at increased                            risk: Family history of colon polyps in multiple                            1st-degree relatives Providers:                Barney Drain MD, MD, Lurline Del, RN, Nelma Rothman,                            Technician Referring MD:             Elayne Snare. Luking Medicines:                Meperidine 100 mg IV, Midazolam 7 mg IV Complications:            No immediate complications. Estimated Blood Loss:     Estimated blood loss was minimal. Procedure:                Pre-Anesthesia Assessment:                           - Prior to the procedure, a History and Physical                            was performed, and patient medications and                            allergies were reviewed. The patient's tolerance of                            previous anesthesia was also reviewed. The risks                            and benefits of the procedure and the sedation                            options and risks were discussed with the patient.                            All questions were answered, and informed consent                            was obtained. Prior Anticoagulants: The patient has                            taken no previous anticoagulant or antiplatelet  agents. ASA Grade Assessment: II - A patient with                            mild systemic disease. After reviewing the risks                            and benefits, the patient was deemed in                            satisfactory condition to undergo the procedure.                            After obtaining informed consent, the colonoscope                     was passed under direct vision. Throughout the                            procedure, the patient's blood pressure, pulse, and                            oxygen saturations were monitored continuously. The                            [id number] scope was introduced through                            the anus and advanced to the the cecum, identified                            by appendiceal orifice and ileocecal valve. The                            colonoscopy was somewhat difficult due to a                            tortuous colon. Successful completion of the                            procedure was aided by increasing the dose of                            sedation medication, straightening and shortening                            the scope to obtain bowel loop reduction and                            COLOWRAP. The patient tolerated the procedure well.                            The quality of the bowel preparation was excellent.  The ileocecal valve, appendiceal orifice, and                            rectum were photographed. Scope In: 10:22:31 AM Scope Out: 10:40:21 AM Scope Withdrawal Time: 0 hours 15 minutes 14 seconds  Total Procedure Duration: 0 hours 17 minutes 50 seconds  Findings:      A 5 mm polyp was found in the proximal descending colon. The polyp was       sessile. The polyp was removed with a cold snare. Resection and       retrieval were complete.      Internal hemorrhoids were found. The hemorrhoids were small.      The recto-sigmoid colon and sigmoid colon were mildly tortuous. Impression:               - One 5 mm polyp in the proximal descending colon,                            removed with a cold snare. Resected and retrieved.                           - Internal hemorrhoids.                           - Tortuous colon. Moderate Sedation:      Moderate (conscious) sedation was administered by the endoscopy  nurse       and supervised by the endoscopist. The following parameters were       monitored: oxygen saturation, heart rate, blood pressure, and response       to care. Total physician intraservice time was 33 minutes. Recommendation:           - Patient has a contact number available for                            emergencies. The signs and symptoms of potential                            delayed complications were discussed with the                            patient. Return to normal activities tomorrow.                            Written discharge instructions were provided to the                            patient.                           - High fiber diet.                           - Continue present medications.                           - Await pathology results.                           -  Repeat colonoscopy in 5-10 years for surveillance. Procedure Code(s):        --- Professional ---                           [phone], Colonoscopy, flexible; with removal of                            tumor(s), polyp(s), or other lesion(s) by snare                            technique                           99153, Moderate sedation; each additional 15                            minutes intraservice time                           G0500, Moderate sedation services provided by the                            same physician or other qualified health care                            professional performing a gastrointestinal                            endoscopic service that sedation supports,                            requiring the presence of an independent trained                            observer to assist in the monitoring of the                            patient's level of consciousness and physiological                            status; initial 15 minutes of intra-service time;                            patient age 18 years or older (additional time may                            be  reported with [phone], as appropriate) Diagnosis Code(s):        --- Professional ---                           Z12.11, Encounter for screening for malignant                            neoplasm of colon  Z83.71, Family history of colonic polyps                           K63.5, Polyp of colon                           K64.8, Other hemorrhoids                           Q43.8, Other specified congenital malformations of                            intestine CPT copyright 2019 American Medical Association. All rights reserved. The codes documented in this report are preliminary and upon coder review may  be revised to meet current compliance requirements. Barney Drain, MD Barney Drain MD, MD 10/26/2019 10:52:07 AM This report has been signed electronically. Number of Addenda: 0

## 2019-10-27 LAB — SURGICAL PATHOLOGY

## 2019-11-15 ENCOUNTER — Ambulatory Visit: Payer: BC Managed Care – PPO | Admitting: Psychology

## 2019-12-11 DIAGNOSIS — D225 Melanocytic nevi of trunk: Secondary | ICD-10-CM | POA: Diagnosis not present

## 2019-12-11 DIAGNOSIS — Z1283 Encounter for screening for malignant neoplasm of skin: Secondary | ICD-10-CM | POA: Diagnosis not present

## 2019-12-11 DIAGNOSIS — L603 Nail dystrophy: Secondary | ICD-10-CM | POA: Diagnosis not present

## 2020-02-05 ENCOUNTER — Ambulatory Visit: Payer: BC Managed Care – PPO | Admitting: Family Medicine

## 2020-02-05 ENCOUNTER — Other Ambulatory Visit: Payer: Self-pay

## 2020-02-05 ENCOUNTER — Encounter: Payer: Self-pay | Admitting: Family Medicine

## 2020-02-05 VITALS — BP 112/72 | HR 74 | Temp 97.2°F | Ht 63.0 in | Wt 121.2 lb

## 2020-02-05 DIAGNOSIS — F419 Anxiety disorder, unspecified: Secondary | ICD-10-CM | POA: Diagnosis not present

## 2020-02-05 MED ORDER — CLONAZEPAM 0.5 MG PO TABS: ORAL_TABLET | ORAL | 0 refills | Status: DC

## 2020-02-05 MED ORDER — SERTRALINE HCL 25 MG PO TABS: ORAL_TABLET | ORAL | 0 refills | Status: DC

## 2020-02-05 NOTE — Progress Notes (Signed)
Patient ID: Kathy Graham, female    DOB: 08/27/1980, 39 y.o.   MRN: [id number]   Chief Complaint  Patient presents with  . Depression    fatigue   Subjective:    HPI Pt seen in 2/21 for similar depression,anxiety and fatigue.  At that time was given lexapro 10mg  and klonapin 1mg  to use at bedtime to help with sleep.  Pt stating not having to take it much at night.  But having more anxiety during the day.  Lots of new changes and stressors currently.  She feels much improved from 2/21, not having the tearfulness.  But having more anxiety and overwhelmed at times.  Was given lexapro and took for 3 days, but made her feel dissociated.  Was also given wellbutrin after, but never started it.  She is going to have a new move and working from home soon.  Going to work 4 months at [employer]  and have a break soon and work remotely. Feeling that pandemic caused some isolation, depression, and anxiety living alone, [place].  Moved in with parents in 5/21. Did counseling for a few months till 5/21 and thinking it helped some.  Medical History Kathy Graham has a past medical history of Reflux.   Outpatient Encounter Medications as of 02/05/2020  Medication Sig  . clonazePAM (KLONOPIN) 0.5 MG tablet Take 1/2 to 1 tab p.o. daily prn anxiety  . drospirenone-ethinyl estradiol (YAZ,GIANVI,LORYNA) 3-0.02 MG tablet Take 1 tablet by mouth daily.  . fexofenadine (ALLEGRA) 180 MG tablet Take 180 mg by mouth daily.  Marland Kitchen MAGNESIUM GLYCINATE PO Take 400 mg by mouth daily.   . Multiple Vitamin (MULTIVITAMIN WITH MINERALS) TABS tablet Take 1 tablet by mouth daily.  . Oxymetazoline HCl (NASAL DECONGESTANT SPRAY NA) Place 2 puffs into the nose daily.   . pantoprazole (PROTONIX) 40 MG tablet Take 1 tablet (40 mg total) by mouth daily before breakfast.  . sertraline (ZOLOFT) 25 MG tablet Take 1/2 tab p.o. qhs for 7 days, then increase to 1 tab daily.  . [DISCONTINUED] clonazePAM (KLONOPIN) 1 MG  tablet 1/2 to one at bedtime as needed for sleep (Patient not taking: Reported on 02/05/2020)   No facility-administered encounter medications on file as of 02/05/2020.     Review of Systems  Constitutional: Negative for chills and fever.  HENT: Negative for congestion, rhinorrhea and sore throat.   Respiratory: Negative for cough, shortness of breath and wheezing.   Cardiovascular: Negative for chest pain and leg swelling.  Gastrointestinal: Negative for abdominal pain, diarrhea, nausea and vomiting.  Genitourinary: Negative for dysuria and frequency.  Musculoskeletal: Negative for arthralgias and back pain.  Skin: Negative for rash.  Neurological: Negative for dizziness, weakness and headaches.  Psychiatric/Behavioral: Negative for agitation, behavioral problems, decreased concentration, hallucinations, sleep disturbance and suicidal ideas. The patient is nervous/anxious. The patient is not hyperactive.      Vitals BP 112/72   Pulse 74   Temp (!) 97.2 F (36.2 C) (Oral)   Ht 5\' 3"  (1.6 m)   Wt 121 lb 3.2 oz (55 kg)   SpO2 100%   BMI 21.47 kg/m   Objective:   Physical Exam Vitals and nursing note reviewed.  Constitutional:      Appearance: Normal appearance.  HENT:     Head: Normocephalic and atraumatic.     Nose: Nose normal.     Mouth/Throat:     Mouth: Mucous membranes are moist.     Pharynx: Oropharynx is  clear.  Eyes:     Extraocular Movements: Extraocular movements intact.     Conjunctiva/sclera: Conjunctivae normal.     Pupils: Pupils are equal, round, and reactive to light.  Cardiovascular:     Rate and Rhythm: Normal rate and regular rhythm.     Pulses: Normal pulses.     Heart sounds: Normal heart sounds.  Pulmonary:     Effort: Pulmonary effort is normal.     Breath sounds: Normal breath sounds. No wheezing, rhonchi or rales.  Musculoskeletal:        General: Normal range of motion.     Right lower leg: No edema.     Left lower leg: No edema.   Skin:    General: Skin is warm and dry.     Findings: No lesion or rash.  Neurological:     General: No focal deficit present.     Mental Status: She is alert and oriented to person, place, and time.  Psychiatric:        Behavior: Behavior normal.     Comments: +anxious mood/affect      Assessment and Plan   1. Anxiety - sertraline (ZOLOFT) 25 MG tablet; Take 1/2 tab p.o. qhs for 7 days, then increase to 1 tab daily.  Dispense: 30 tablet; Refill: 0 - clonazePAM (KLONOPIN) 0.5 MG tablet; Take 1/2 to 1 tab p.o. daily prn anxiety  Dispense: 20 tablet; Refill: 0   Anxiety- worsened.   Depression-improved. Trial of zoloft at very low dose to see if she can tolerate it, then increase from 1/2 tabl to 1 tab per day of zoloft 25mg . Likely will need to increase zoloft, but pt willing to give it another try.  Gave small amt klonapin at lower dose to use prn during day for anxiety.  Pt has some 1mg  klonapin still that she used at night for sleep, taking 1/2 tab at night for sleep.  F/u 4 wks. Pt advised to call if worsening symptoms or SI. Pt in agreement.

## 2020-02-07 DIAGNOSIS — L821 Other seborrheic keratosis: Secondary | ICD-10-CM | POA: Diagnosis not present

## 2020-02-29 ENCOUNTER — Ambulatory Visit: Payer: BC Managed Care – PPO | Admitting: Family Medicine

## 2020-04-12 DIAGNOSIS — Z01419 Encounter for gynecological examination (general) (routine) without abnormal findings: Secondary | ICD-10-CM | POA: Diagnosis not present

## 2020-04-12 DIAGNOSIS — Z1231 Encounter for screening mammogram for malignant neoplasm of breast: Secondary | ICD-10-CM | POA: Diagnosis not present

## 2020-04-23 ENCOUNTER — Encounter: Payer: Self-pay | Admitting: Internal Medicine

## 2020-05-03 DIAGNOSIS — Z23 Encounter for immunization: Secondary | ICD-10-CM | POA: Diagnosis not present

## 2020-05-08 ENCOUNTER — Other Ambulatory Visit: Payer: Self-pay | Admitting: Gastroenterology

## 2020-06-22 ENCOUNTER — Telehealth: Payer: BC Managed Care – PPO | Admitting: Nurse Practitioner

## 2020-06-22 DIAGNOSIS — J0100 Acute maxillary sinusitis, unspecified: Secondary | ICD-10-CM

## 2020-06-22 MED ORDER — DOXYCYCLINE HYCLATE 100 MG PO TABS
100.0000 mg | ORAL_TABLET | Freq: Two times a day (BID) | ORAL | 0 refills | Status: DC
Start: 2020-06-22 — End: 2020-07-10

## 2020-06-22 NOTE — Progress Notes (Signed)

## 2020-06-29 ENCOUNTER — Telehealth: Payer: BC Managed Care – PPO | Admitting: Physician Assistant

## 2020-06-29 ENCOUNTER — Encounter: Payer: Self-pay | Admitting: Physician Assistant

## 2020-06-29 MED ORDER — DOXYCYCLINE HYCLATE 100 MG PO TABS
100.0000 mg | ORAL_TABLET | Freq: Two times a day (BID) | ORAL | 0 refills | Status: DC
Start: 2020-06-29 — End: 2020-07-10

## 2020-06-29 NOTE — Progress Notes (Signed)
  I have cancelled this Evisit to avoid a charge. I have sent it the antibiotics to to complete a 10 day regimen.   NOTE: If you entered your credit card information for this eVisit, you will not be charged. You may see a "hold" on your card for the $35 but that hold will drop off and you will not have a charge processed.   If you are having a true medical emergency please call 911.      For an urgent face to face visit, [facility] has five urgent care centers for your convenience:     [facility] [place] [facility] [place] Get Driving Directions [phone] [place], [place] [address] . 10 am - 6pm Monday - Friday    [facility] [place] [facility]) Get Driving Directions [phone] [address] [place], [place] [address] . 10 am to 8 pm Monday-Friday . 12 pm to 8 pm [facility] Get Driving Directions [phone] [place], [place], [place] [address] . 8 am to 8 pm Monday-Friday . 9 am to 6 pm Saturday . 11 am to 6 pm Sunday     [facility] at [facility] Get Driving Directions  [phone] [address], [place] [address] . 8 am to 8 pm Monday-Friday . 8 am to 4 pm [facility] at [place] Get Driving Directions [phone] [address] [place], [place] [address] . 12 pm to 6 pm Monday-Friday      Your e-visit answers were reviewed by a board certified advanced clinical practitioner to complete your personal care plan.  Thank you for using e-Visits.    I spent 5-10 minutes on review and completion of this note- Lacy Duverney [facility]

## 2020-07-10 ENCOUNTER — Other Ambulatory Visit: Payer: Self-pay

## 2020-07-10 ENCOUNTER — Telehealth (INDEPENDENT_AMBULATORY_CARE_PROVIDER_SITE_OTHER): Payer: BC Managed Care – PPO | Admitting: Family Medicine

## 2020-07-10 DIAGNOSIS — J0101 Acute recurrent maxillary sinusitis: Secondary | ICD-10-CM | POA: Diagnosis not present

## 2020-07-10 MED ORDER — AMOXICILLIN 500 MG PO CAPS
500.0000 mg | ORAL_CAPSULE | Freq: Three times a day (TID) | ORAL | 0 refills | Status: DC
Start: 2020-07-10 — End: 2020-11-19

## 2020-07-10 NOTE — Progress Notes (Signed)
Patient ID: Kathy Graham, female    DOB: 01-04-1981, 40 y.o.   MRN: [id number]   Virtual Visit via Telephone Note  I connected with Kathy Graham on 07/10/20 at 10:40 AM EST by telephone and verified that I am speaking with the correct person using two identifiers.  Location: Patient: home Provider: office   I discussed the limitations, risks, security and privacy concerns of performing an evaluation and management service by telephone and the availability of in person appointments. I also discussed with the patient that there may be a patient responsible charge related to this service. The patient expressed understanding and agreed to proceed.   Chief Complaint  Patient presents with  . Sinusitis   Subjective:    HPI   Pt having nasal congestion/pressure and ear pain. Pt just had e visit on Jan 1st and finished doxy 10 days and it helped but did not clear up all the way. Having sinus pressure across cheek bones and ears feel full.  Then feeling it got worse on 06/21/20. Did e-visit through [facility].   Then started feeling it is coming back and 4 days ago.  Feeling some fullness in rt sinus and both ears.  Right side worse. No fever.  No sore throat.   occ coughing. Sick contacts- none. Mom with a "sinus infection." Was tested negative for covid recently, wife.   Pt is out of town in [employer].  Using nasort cort and allegra daily.   Medical History Catharina has a past medical history of Reflux.   Outpatient Encounter Medications as of 07/10/2020  Medication Sig  . amoxicillin (AMOXIL) 500 MG capsule Take 1 capsule (500 mg total) by mouth 3 (three) times daily.  . clonazePAM (KLONOPIN) 0.5 MG tablet Take 1/2 to 1 tab p.o. daily prn anxiety  . drospirenone-ethinyl estradiol (YAZ,GIANVI,LORYNA) 3-0.02 MG tablet Take 1 tablet by mouth daily.  . fexofenadine (ALLEGRA) 180 MG tablet Take 180 mg by mouth daily.  . Multiple Vitamin (MULTIVITAMIN WITH MINERALS)  TABS tablet Take 1 tablet by mouth daily.  . Oxymetazoline HCl (NASAL DECONGESTANT SPRAY NA) Place 2 puffs into the nose daily.   . pantoprazole (PROTONIX) 40 MG tablet TAKE 1 TABLET DAILY BEFORE BREAKFAST  . [DISCONTINUED] MAGNESIUM GLYCINATE PO Take 400 mg by mouth daily.   . [DISCONTINUED] sertraline (ZOLOFT) 25 MG tablet Take 1/2 tab p.o. qhs for 7 days, then increase to 1 tab daily.  . [DISCONTINUED] doxycycline (VIBRA-TABS) 100 MG tablet Take 1 tablet (100 mg total) by mouth 2 (two) times daily. 1 po bid  . [DISCONTINUED] doxycycline (VIBRA-TABS) 100 MG tablet Take 1 tablet (100 mg total) by mouth 2 (two) times daily.   No facility-administered encounter medications on file as of 07/10/2020.       Review of Systems  Constitutional: Negative for chills and fever.  HENT: Positive for congestion, ear pain, sinus pressure and sinus pain. Negative for postnasal drip, rhinorrhea, sneezing and sore throat.   Eyes: Negative for photophobia, pain, discharge, redness and itching.  Respiratory: Positive for cough (intermittent). Negative for shortness of breath and wheezing.   Cardiovascular: Negative for chest pain and leg swelling.  Gastrointestinal: Negative for abdominal pain, diarrhea, nausea and vomiting.  Genitourinary: Negative for dysuria and frequency.  Musculoskeletal: Negative for arthralgias and back pain.  Skin: Negative for rash.  Neurological: Negative for dizziness, weakness and headaches.     Vitals There were no vitals taken for this visit.  Objective:  Physical Exam No PE due to phone visit.  Assessment and Plan   1. Acute recurrent maxillary sinusitis   Empiric tx for sinusitis- Pt stating the augmentin causes GI upset. But tolerates amoxicillin. Just had doxycycline on 06/22/20.  Will given amoxicillin.  Advising to use netti pot or sinus rinses daily. Cont with flonase and allergy medications daily for next 10-14 days.  If not improving then will need in  person visit to have exam.  Pt in agreement.  F/u prn.    Follow Up Instructions:    I discussed the assessment and treatment plan with the patient. The patient was provided an opportunity to ask questions and all were answered. The patient agreed with the plan and demonstrated an understanding of the instructions.   The patient was advised to call back or seek an in-person evaluation if the symptoms worsen or if the condition fails to improve as anticipated.  I provided 15 minutes of non-face-to-face time during this encounter.

## 2020-08-23 DIAGNOSIS — H1031 Unspecified acute conjunctivitis, right eye: Secondary | ICD-10-CM | POA: Diagnosis not present

## 2020-11-19 ENCOUNTER — Telehealth: Payer: BC Managed Care – PPO | Admitting: Physician Assistant

## 2020-11-19 DIAGNOSIS — B9689 Other specified bacterial agents as the cause of diseases classified elsewhere: Secondary | ICD-10-CM

## 2020-11-19 DIAGNOSIS — J019 Acute sinusitis, unspecified: Secondary | ICD-10-CM | POA: Diagnosis not present

## 2020-11-19 DIAGNOSIS — Z8709 Personal history of other diseases of the respiratory system: Secondary | ICD-10-CM

## 2020-11-19 MED ORDER — DOXYCYCLINE HYCLATE 100 MG PO TABS: 100.0000 mg | ORAL_TABLET | Freq: Two times a day (BID) | ORAL | 0 refills | Status: DC

## 2020-11-19 NOTE — Progress Notes (Signed)
I have spent 5 minutes in review of e-visit questionnaire, review and updating patient chart, medical decision making and response to patient.   Annalee Meyerhoff Cody Ayde Record, PA-C    

## 2020-11-19 NOTE — Progress Notes (Signed)

## 2020-12-17 ENCOUNTER — Telehealth: Payer: BC Managed Care – PPO | Admitting: Physician Assistant

## 2020-12-17 DIAGNOSIS — Z8709 Personal history of other diseases of the respiratory system: Secondary | ICD-10-CM

## 2020-12-17 MED ORDER — DOXYCYCLINE HYCLATE 100 MG PO CAPS: 100.0000 mg | ORAL_CAPSULE | Freq: Two times a day (BID) | ORAL | 0 refills | Status: DC

## 2020-12-17 NOTE — Progress Notes (Signed)
I have spent 5 minutes in review of e-visit questionnaire, review and updating patient chart, medical decision making and response to patient.   Kathy Graham Kathy Graham [place], PA-C    

## 2020-12-17 NOTE — Progress Notes (Signed)

## 2021-01-09 ENCOUNTER — Telehealth (INDEPENDENT_AMBULATORY_CARE_PROVIDER_SITE_OTHER): Payer: BC Managed Care – PPO | Admitting: Family Medicine

## 2021-01-09 ENCOUNTER — Other Ambulatory Visit: Payer: Self-pay

## 2021-01-09 ENCOUNTER — Telehealth: Payer: Self-pay | Admitting: Family Medicine

## 2021-01-09 DIAGNOSIS — J320 Chronic maxillary sinusitis: Secondary | ICD-10-CM

## 2021-01-09 MED ORDER — TRIAMCINOLONE ACETONIDE 55 MCG/ACT NA AERO: 2.0000 | INHALATION_SPRAY | Freq: Every day | NASAL | 3 refills | Status: DC

## 2021-01-09 MED ORDER — LEVOFLOXACIN 500 MG PO TABS: 500.0000 mg | ORAL_TABLET | Freq: Every day | ORAL | 0 refills | Status: AC

## 2021-01-09 NOTE — Progress Notes (Signed)
Virtual Visit via Telephone Note  I connected with Kathy Graham on 01/22/21 at 10:40 AM EDT by telephone and verified that I am speaking with the correct person using two identifiers.  Location: Patient: home Provider: office   I discussed the limitations, risks, security and privacy concerns of performing an evaluation and management service by telephone and the availability of in person appointments. I also discussed with the patient that there may be a patient responsible charge related to this service. The patient expressed understanding and agreed to proceed.     Patient ID: Kathy Graham, female    DOB: [birth year], 40 y.o.   MRN: [id number]   Chief Complaint  Patient presents with   Sinusitis   Subjective:    HPI Pt having sinus pressure at top of cheekbones, top of cheek pain, drainage, ear fullness and low grade fever (99.2 yesterday and 99.0 today). Pt has had multiple sinus infections since January and has done e-visits. Pt has been prescribed Doxycycline each time. Symptoms flared up about 1-1.5 weeks ago. Pt has been taking Tylenol. No recent Covid test with this flare up.   Top of teeth pain, and yellow in color with drainage. Was given doxycyclin in May 2022.  Feeling has had this about 3x in past few months. Then had another round of doxy about 1.5 wks ago. Never had this recurrent issue.  Usually amoxicillin works better for her.   No h/o sinus imaging.   Medical History Kathy Graham has a past medical history of Reflux.   Outpatient Encounter Medications as of 01/09/2021  Medication Sig   drospirenone-ethinyl estradiol (YAZ,GIANVI,LORYNA) 3-0.02 MG tablet Take 1 tablet by mouth daily.   fexofenadine (ALLEGRA) 180 MG tablet Take 180 mg by mouth daily.   [EXPIRED] levofloxacin (LEVAQUIN) 500 MG tablet Take 1 tablet (500 mg total) by mouth daily for 7 days.   Multiple Vitamin (MULTIVITAMIN WITH MINERALS) TABS tablet Take 1 tablet by mouth daily.    Oxymetazoline HCl (NASAL DECONGESTANT SPRAY NA) Place 2 puffs into the nose daily.    pantoprazole (PROTONIX) 40 MG tablet TAKE 1 TABLET DAILY BEFORE BREAKFAST   triamcinolone (NASACORT ALLERGY 24HR) 55 MCG/ACT AERO nasal inhaler Place 2 sprays into the nose daily.   clonazePAM (KLONOPIN) 0.5 MG tablet Take 1/2 to 1 tab p.o. daily prn anxiety (Patient not taking: Reported on 01/09/2021)   [DISCONTINUED] doxycycline (VIBRAMYCIN) 100 MG capsule Take 1 capsule (100 mg total) by mouth 2 (two) times daily.   No facility-administered encounter medications on file as of 01/09/2021.     Review of Systems  Constitutional:  Negative for chills and fever.  HENT:  Positive for congestion, sinus pressure and sinus pain. Negative for rhinorrhea and sore throat.        Upper teeth pain  Respiratory:  Negative for cough, shortness of breath and wheezing.   Cardiovascular:  Negative for chest pain and leg swelling.  Gastrointestinal:  Negative for abdominal pain, diarrhea, nausea and vomiting.  Genitourinary:  Negative for dysuria and frequency.  Musculoskeletal:  Negative for arthralgias and back pain.  Skin:  Negative for rash.  Neurological:  Negative for dizziness, weakness and headaches.    Vitals There were no vitals taken for this visit.  Objective:   Physical Exam No PE due to phone visit.  Assessment and Plan   1. Chronic maxillary sinusitis - levofloxacin (LEVAQUIN) 500 MG tablet; Take 1 tablet (500 mg total) by mouth daily for 7 days.  Dispense: 7 tablet;  Refill: 0 - Ambulatory referral to ENT - triamcinolone (NASACORT ALLERGY 24HR) 55 MCG/ACT AERO nasal inhaler; Place 2 sprays into the nose daily.  Dispense: 1 each; Refill: 3   Pt just finished doxycycline feeling like her symptoms have returned.  Will give course of levaquin and advising adding claritin-d or allegra -d and netti-pot and tylenol/ibuprofen.   Reviewed risk of running/exercising with levaquin to prevent ruptured  tendon. Pt voiced understanding.  Call or rto if not improving.  Return if symptoms worsen or fail to improve.     Follow Up Instructions:    I discussed the assessment and treatment plan with the patient. The patient was provided an opportunity to ask questions and all were answered. The patient agreed with the plan and demonstrated an understanding of the instructions.   The patient was advised to call back or seek an in-person evaluation if the symptoms worsen or if the condition fails to improve as anticipated.  I provided 15 minutes of non-face-to-face time during this encounter.

## 2021-01-09 NOTE — Telephone Encounter (Signed)
Kathy Graham, Kathy Graham are scheduled for a virtual visit with your provider today.    Just as we do with appointments in the office, we must obtain your consent to participate.  Your consent will be active for this visit and any virtual visit you may have with one of our providers in the next 365 days.    If you have a MyChart account, I can also send a copy of this consent to you electronically.  All virtual visits are billed to your insurance company just like a traditional visit in the office.  As this is a virtual visit, video technology does not allow for your provider to perform a traditional examination.  This may limit your provider's ability to fully assess your condition.  If your provider identifies any concerns that need to be evaluated in person or the need to arrange testing such as labs, EKG, etc, we will make arrangements to do so.    Although advances in technology are sophisticated, we cannot ensure that it will always work on either your end or our end.  If the connection with a video visit is poor, we may have to switch to a telephone visit.  With either a video or telephone visit, we are not always able to ensure that we have a secure connection.   I need to obtain your verbal consent now.   Are you willing to proceed with your visit today?   Kathy Graham has provided verbal consent on 01/09/2021 for a virtual visit (video or telephone).   Vicente Males, LPN 0/13/1438  88:75 AM

## 2021-02-05 DIAGNOSIS — L821 Other seborrheic keratosis: Secondary | ICD-10-CM | POA: Diagnosis not present

## 2021-02-05 DIAGNOSIS — Z1283 Encounter for screening for malignant neoplasm of skin: Secondary | ICD-10-CM | POA: Diagnosis not present

## 2021-02-05 DIAGNOSIS — D225 Melanocytic nevi of trunk: Secondary | ICD-10-CM | POA: Diagnosis not present

## 2021-02-13 ENCOUNTER — Other Ambulatory Visit: Payer: Self-pay

## 2021-02-13 ENCOUNTER — Ambulatory Visit
Admission: RE | Admit: 2021-02-13 | Discharge: 2021-02-13 | Disposition: A | Discharge status: Home / Self Care | Payer: BC Managed Care – PPO | Source: Ambulatory Visit | Attending: Emergency Medicine | Admitting: Emergency Medicine

## 2021-02-13 VITALS — BP 107/70 | HR 79 | Temp 98.3°F | Resp 18

## 2021-02-13 DIAGNOSIS — R0981 Nasal congestion: Secondary | ICD-10-CM

## 2021-02-13 MED ORDER — SULFAMETHOXAZOLE-TRIMETHOPRIM 800-160 MG PO TABS: 1.0000 | ORAL_TABLET | Freq: Two times a day (BID) | ORAL | 0 refills | Status: AC

## 2021-02-13 NOTE — ED Provider Notes (Signed)
[place]   [id number] 02/13/21 Arrival Time: 1752   CC: sinus infection  SUBJECTIVE: History from: patient.  Kathy Graham is a 40 y.o. female who presents with acute on chronic sinus congestion and maxillary sinus pain with recent flare x few days.  Denies sick exposure to COVID, flu or strep.  Denies alleviating or aggravating factors.  Reports recurrent sinus infections.   Denies fever, chills, rhinorrhea, sore throat, SOB, wheezing, chest pain, nausea, changes in bowel or bladder habits.     ROS: As per HPI.  All other pertinent ROS negative.     Past Medical History:  Diagnosis Date   Reflux    Past Surgical History:  Procedure Laterality Date   BACK SURGERY     BIOPSY  12/16/2018   Procedure: BIOPSY;  Surgeon: Danie Binder, MD;  [facility]: [facility];  Service: Endoscopy;;  esophagus gastric    COLONOSCOPY N/A 10/26/2019   Procedure: COLONOSCOPY;  Surgeon: Danie Binder, MD;  [facility]: [facility];  Service: Endoscopy;  Laterality: N/A;  930am   ESOPHAGOGASTRODUODENOSCOPY N/A 12/16/2018   Dr. Oneida Alar: Patchy mild inflammation characterized by congestion and erythema in the gastric body and gastric antrum.  Web found in the distal esophagus status post dilation.  Gastric biopsies showed gastritis but no H. pylori.  Esophageal biopsy showed reflux.   POLYPECTOMY  10/26/2019   Procedure: POLYPECTOMY;  Surgeon: Danie Binder, MD;  Location: [facility];  Service: Endoscopy;;  descending   Allergies  Allergen Reactions   Augmentin [Amoxicillin-Pot Clavulanate] Nausea And Vomiting   Cefzil [Cefprozil]     Serum sickness   No current facility-administered medications on file prior to encounter.   Current Outpatient Medications on File Prior to Encounter  Medication Sig Dispense Refill   clonazePAM (KLONOPIN) 0.5 MG tablet Take 1/2 to 1 tab p.o. daily prn anxiety (Patient not taking: Reported on 01/09/2021) 20 tablet 0   drospirenone-ethinyl estradiol  (YAZ,GIANVI,LORYNA) 3-0.02 MG tablet Take 1 tablet by mouth daily.     fexofenadine (ALLEGRA) 180 MG tablet Take 180 mg by mouth daily.     Multiple Vitamin (MULTIVITAMIN WITH MINERALS) TABS tablet Take 1 tablet by mouth daily.     Oxymetazoline HCl (NASAL DECONGESTANT SPRAY NA) Place 2 puffs into the nose daily.      pantoprazole (PROTONIX) 40 MG tablet TAKE 1 TABLET DAILY BEFORE BREAKFAST 90 tablet 3   triamcinolone (NASACORT ALLERGY 24HR) 55 MCG/ACT AERO nasal inhaler Place 2 sprays into the nose daily. 1 each 3   Social History   Socioeconomic History   Marital status: Unknown    Spouse name: Not on file   Number of children: Not on file   Years of education: Not on file   Highest education level: Not on file  Occupational History   Not on file  Tobacco Use   Smoking status: Never   Smokeless tobacco: Never  Vaping Use   Vaping Use: Not on file  Substance and Sexual Activity   Alcohol use: Yes    Comment: occ glass of wine   Drug use: No   Sexual activity: Not Currently    Birth control/protection: Pill  Other Topics Concern   Not on file  Social History Narrative   Not on file   Social Determinants of Health   Financial Resource Strain: Not on file  Food Insecurity: Not on file  Transportation Needs: Not on file  Physical Activity: Not on file  Stress: Not  on file  Social Connections: Not on file  Intimate Partner Violence: Not on file   Family History  Problem Relation Age of Onset   Hyperlipidemia Mother    Colon polyps Mother        before age 29   Hypothyroidism Mother    Hypertension Father    Colon polyps Father        before age 42   Cancer Brother 69       melanoma   Colon cancer Other     OBJECTIVE:  Vitals:   02/13/21 1803  BP: 107/70  Pulse: 79  Resp: 18  Temp: 98.3 F (36.8 C)  SpO2: 100%    General appearance: alert; well-appearing, nontoxic; speaking in full sentences and tolerating own secretions HEENT: NCAT; Ears: EACs clear,  TMs pearly gray; Eyes: PERRL.  EOM grossly intact.Nose: nares patent without rhinorrhea, Throat: oropharynx clear, tonsils non erythematous or enlarged, uvula midline  Neck: supple without LAD Lungs: unlabored respirations, cough: absent; no respiratory distress Skin: warm and dry Psychological: alert and cooperative; normal mood and affect   ASSESSMENT & PLAN:  1. Sinus congestion     Meds ordered this encounter  Medications   sulfamethoxazole-trimethoprim (BACTRIM DS) 800-160 MG tablet    Sig: Take 1 tablet by mouth 2 (two) times daily for 10 days.    Dispense:  20 tablet    Refill:  0    Order Specific Question:   Supervising Provider    Answer:   Raylene Everts [id number]   Rest and push fluids Antibiotic prescribed.  Take as directed and to completion Continue with OTC ibuprofen/tylenol as needed for pain Follow up with ENT Return or go to the ED if you have any new or worsening symptoms such as fever, chills, worsening sinus pain/pressure, cough, sore throat, chest pain, shortness of breath, abdominal pain, changes in bowel or bladder habits, etc...   Reviewed expectations re: course of current medical issues. Questions answered. Outlined signs and symptoms indicating need for more acute intervention. Patient verbalized understanding. After Visit Summary given.          Lestine Box, PA-C 02/13/21 [id number]

## 2021-02-13 NOTE — Discharge Instructions (Addendum)
Rest and push fluids Antibiotic prescribed.  Take as directed and to completion Continue with OTC ibuprofen/tylenol as needed for pain Follow up with ENT Return or go to the ED if you have any new or worsening symptoms such as fever, chills, worsening sinus pain/pressure, cough, sore throat, chest pain, shortness of breath, abdominal pain, changes in bowel or bladder habits, etc..Marland Kitchen

## 2021-02-13 NOTE — ED Triage Notes (Signed)
Pt presents with c/o recurrent sinusitis, has appointment with ent next week but has become too painful to wait

## 2021-02-18 ENCOUNTER — Ambulatory Visit (INDEPENDENT_AMBULATORY_CARE_PROVIDER_SITE_OTHER): Payer: BC Managed Care – PPO | Admitting: Otolaryngology

## 2021-02-18 ENCOUNTER — Other Ambulatory Visit: Payer: Self-pay

## 2021-02-18 DIAGNOSIS — J0101 Acute recurrent maxillary sinusitis: Secondary | ICD-10-CM

## 2021-02-18 NOTE — Progress Notes (Signed)
HPI: Kathy Graham is a 40 y.o. female who presents is referred by her PCP for evaluation of chronic recurring sinus infections.  She states that when she gets the sinus infections she gets pain in her cheeks and pain in the teeth.  She has some nasal congestion and does not feel well.  She usually only gets 1 or 2 sinus infections a year but this past few months she has had a couple infections and most recently was treated with Bactrim last week. She does have history of allergies and uses Nasacort every morning as well as Allegra and simply saline intermittently.  She has not previously had any x-rays of her sinuses..  Past Medical History:  Diagnosis Date   Reflux    Past Surgical History:  Procedure Laterality Date   BACK SURGERY     BIOPSY  12/16/2018   Procedure: BIOPSY;  Surgeon: Danie Binder, MD;  [facility];  Service: Endoscopy;;  esophagus gastric    COLONOSCOPY N/A 10/26/2019   Procedure: COLONOSCOPY;  Surgeon: Danie Binder, MD;  [facility];  Service: Endoscopy;  Laterality: N/A;  930am   ESOPHAGOGASTRODUODENOSCOPY N/A 12/16/2018   Dr. Oneida Alar: Patchy mild inflammation characterized by congestion and erythema in the gastric body and gastric antrum.  Web found in the distal esophagus status post dilation.  Gastric biopsies showed gastritis but no H. pylori.  Esophageal biopsy showed reflux.   POLYPECTOMY  10/26/2019   Procedure: POLYPECTOMY;  Surgeon: Danie Binder, MD;  [facility];  Service: Endoscopy;;  descending   Social History   Socioeconomic History   Marital status: Unknown    Spouse name: Not on file   Number of children: Not on file   Years of education: Not on file   Highest education level: Not on file  Occupational History   Not on file  Tobacco Use   Smoking status: Never   Smokeless tobacco: Never  Vaping Use   Vaping Use: Not on file  Substance and Sexual Activity   Alcohol use: Yes    Comment: occ glass of  wine   Drug use: No   Sexual activity: Not Currently    Birth control/protection: Pill  Other Topics Concern   Not on file  Social History Narrative   Not on file   Social Determinants of Health   Financial Resource Strain: Not on file  Food Insecurity: Not on file  Transportation Needs: Not on file  Physical Activity: Not on file  Stress: Not on file  Social Connections: Not on file   Family History  Problem Relation Age of Onset   Hyperlipidemia Mother    Colon polyps Mother        before age 65   Hypothyroidism Mother    Hypertension Father    Colon polyps Father        before age 50   Cancer Brother 33       melanoma   Colon cancer Other    Allergies  Allergen Reactions   Augmentin [Amoxicillin-Pot Clavulanate] Nausea And Vomiting   Cefzil [Cefprozil]     Serum sickness   Prior to Admission medications   Medication Sig Start Date End Date Taking? Authorizing Provider  clonazePAM (KLONOPIN) 0.5 MG tablet Take 1/2 to 1 tab p.o. daily prn anxiety Patient not taking: Reported on 01/09/2021 02/05/20   Elvia Collum M, DO  drospirenone-ethinyl estradiol (YAZ,GIANVI,LORYNA) 3-0.02 MG tablet Take 1 tablet by mouth daily.  [provider]  fexofenadine (ALLEGRA) 180 MG tablet Take 180 mg by mouth daily.    [provider]  Multiple Vitamin (MULTIVITAMIN WITH MINERALS) TABS tablet Take 1 tablet by mouth daily.    [provider]  Oxymetazoline HCl (NASAL DECONGESTANT SPRAY NA) Place 2 puffs into the nose daily.     [provider]  pantoprazole (PROTONIX) 40 MG tablet TAKE 1 TABLET DAILY BEFORE BREAKFAST 05/08/20   Annitta Needs, NP  sulfamethoxazole-trimethoprim (BACTRIM DS) 800-160 MG tablet Take 1 tablet by mouth 2 (two) times daily for 10 days. 02/13/21 02/23/21  [place], [place], PA-C  triamcinolone (NASACORT ALLERGY 24HR) 55 MCG/ACT AERO nasal inhaler Place 2 sprays into the nose daily. 01/09/21   Elvia Collum M, DO     Positive  ROS: Otherwise negative  All other systems have been reviewed and were otherwise negative with the exception of those mentioned in the HPI and as above.  Physical Exam: Constitutional: Alert, well-appearing, no acute distress Ears: External ears without lesions or tenderness. Ear canals are clear bilaterally with intact, clear TMs bilaterally. Nasal: External nose without lesions. Septum with minimal deformity.  After decongesting the nose nasal endoscopy was performed and on nasal endoscopy both millimeters regions were clear with no acute mucopurulent discharge noted.  The nasopharynx was clear.  No polyps noted..  Oral: Lips and gums without lesions. Tongue and palate mucosa without lesions. Posterior oropharynx clear. Neck: No palpable adenopathy or masses Respiratory: Breathing comfortably  Skin: No facial/neck lesions or rash noted.  Procedures  Assessment: History of recurrent sinus infections.  These apparently have increased in frequency.  On clinical exam today no evidence of acute infection presently.  Plan: We will plan on scheduling a CT scan of the sinuses to better evaluate the degree of sinus disease. In the meantime she will continue with the Nasacort and an antibiotic until this is completed.   Radene Journey, MD   CC:

## 2021-02-19 ENCOUNTER — Other Ambulatory Visit (INDEPENDENT_AMBULATORY_CARE_PROVIDER_SITE_OTHER): Payer: Self-pay | Admitting: Otolaryngology

## 2021-02-19 DIAGNOSIS — J320 Chronic maxillary sinusitis: Secondary | ICD-10-CM

## 2021-03-12 ENCOUNTER — Other Ambulatory Visit: Payer: Self-pay

## 2021-03-12 ENCOUNTER — Ambulatory Visit
Admission: RE | Admit: 2021-03-12 | Discharge: 2021-03-12 | Disposition: A | Discharge status: Home / Self Care | Payer: BC Managed Care – PPO | Source: Ambulatory Visit | Attending: Otolaryngology | Admitting: Otolaryngology

## 2021-03-12 DIAGNOSIS — J320 Chronic maxillary sinusitis: Secondary | ICD-10-CM

## 2021-03-12 DIAGNOSIS — R519 Headache, unspecified: Secondary | ICD-10-CM | POA: Diagnosis not present

## 2021-03-20 ENCOUNTER — Telehealth (INDEPENDENT_AMBULATORY_CARE_PROVIDER_SITE_OTHER): Payer: Self-pay | Admitting: Otolaryngology

## 2021-03-20 NOTE — Telephone Encounter (Signed)
Called Kathy Graham concerning results of her CT scan of the sinuses performed last week.  I reviewed this is showed clear paranasal sinuses with no evidence of acute or chronic sinus disease and patent OMCs bilaterally. I discussed this with the patient and recommended use of nasal steroid spray if she has any problems with her nose or sinuses in the future. She will follow-up as needed.

## 2021-03-27 ENCOUNTER — Telehealth: Payer: BC Managed Care – PPO | Admitting: Emergency Medicine

## 2021-03-27 DIAGNOSIS — J0100 Acute maxillary sinusitis, unspecified: Secondary | ICD-10-CM

## 2021-03-27 DIAGNOSIS — R0981 Nasal congestion: Secondary | ICD-10-CM | POA: Diagnosis not present

## 2021-03-27 MED ORDER — DOXYCYCLINE HYCLATE 100 MG PO TABS
100.0000 mg | ORAL_TABLET | Freq: Two times a day (BID) | ORAL | 0 refills | Status: AC
Start: 2021-03-27 — End: 2021-04-06

## 2021-03-27 NOTE — Progress Notes (Signed)
I have spent 5 minutes in review of e-visit questionnaire, review and updating patient chart, medical decision making and response to patient.   Cheril Slattery, PA-C    

## 2021-03-27 NOTE — Progress Notes (Signed)
E-Visit for Sinus Problems  We are sorry that you are not feeling well.  Here is how we plan to help!  Based on what you have shared with me it looks like you have sinusitis.  Sinusitis is inflammation and infection in the sinus cavities of the head.  Based on your presentation I believe you most likely have Acute Bacterial Sinusitis.  This is an infection caused by bacteria and is treated with antibiotics. I have prescribed Doxycycline 100mg  by mouth twice a day for 10 days. You may use an oral decongestant such as Mucinex D or if you have glaucoma or high blood pressure use plain Mucinex. Saline nasal spray help and can safely be used as often as needed for congestion.  If you develop worsening sinus pain, fever or notice severe headache and vision changes, or if symptoms are not better after completion of antibiotic, please schedule an appointment with a health care provider.    Sinus infections are not as easily transmitted as other respiratory infection, however we still recommend that you avoid close contact with loved ones, especially the very young and elderly.  Remember to wash your hands thoroughly throughout the day as this is the number one way to prevent the spread of infection!  Please follow up with your PCP or in person if symptoms are not improving with antibiotic over the next 24-48 hours.    Home Care: Only take medications as instructed by your medical team. Complete the entire course of an antibiotic. Do not take these medications with alcohol. A steam or ultrasonic humidifier can help congestion.  You can place a towel over your head and breathe in the steam from hot water coming from a faucet. Avoid close contacts especially the very young and the elderly. Cover your mouth when you cough or sneeze. Always remember to wash your hands.  Get Help Right Away If: You develop worsening fever or sinus pain. You develop a severe head ache or visual changes. Your symptoms persist  after you have completed your treatment plan.  Make sure you Understand these instructions. Will watch your condition. Will get help right away if you are not doing well or get worse.  Thank you for choosing an e-visit.  Your e-visit answers were reviewed by a board certified advanced clinical practitioner to complete your personal care plan. Depending upon the condition, your plan could have included both over the counter or prescription medications.  Please review your pharmacy choice. Make sure the pharmacy is open so you can pick up prescription now. If there is a problem, you may contact your provider through [employer] and have the prescription routed to another pharmacy.  Your safety is important to [place]. If you have drug allergies check your prescription carefully.   For the next 24 hours you can use MyChart to ask questions about today's visit, request a non-urgent call back, or ask for a work or school excuse. You will get an email in the next two days asking about your experience. I hope that your e-visit has been valuable and will speed your recovery.

## 2021-05-05 ENCOUNTER — Other Ambulatory Visit: Payer: Self-pay | Admitting: Gastroenterology

## 2021-05-06 ENCOUNTER — Encounter: Payer: Self-pay | Admitting: Internal Medicine

## 2021-05-06 NOTE — Telephone Encounter (Signed)
Needs ov for refills.

## 2021-05-22 ENCOUNTER — Other Ambulatory Visit: Payer: Self-pay | Admitting: Obstetrics

## 2021-05-22 DIAGNOSIS — Z1231 Encounter for screening mammogram for malignant neoplasm of breast: Secondary | ICD-10-CM

## 2021-06-04 DIAGNOSIS — Z01419 Encounter for gynecological examination (general) (routine) without abnormal findings: Secondary | ICD-10-CM | POA: Diagnosis not present

## 2021-06-04 DIAGNOSIS — Z1231 Encounter for screening mammogram for malignant neoplasm of breast: Secondary | ICD-10-CM | POA: Diagnosis not present

## 2021-06-04 DIAGNOSIS — Z6822 Body mass index (BMI) 22.0-22.9, adult: Secondary | ICD-10-CM | POA: Diagnosis not present

## 2021-06-12 IMAGING — CR PORTABLE CHEST - 1 VIEW
1 series · 2 of 2 positions shown · non-contrast
Comparison: 12/07/2013.

CLINICAL DATA: Vomiting.  Sensation of something stuck in throat.

EXAM:
PORTABLE CHEST 1 VIEW

[Series 1: portable · 0.17mm/px · 2 of 2 slices shown]
[im 1/2]
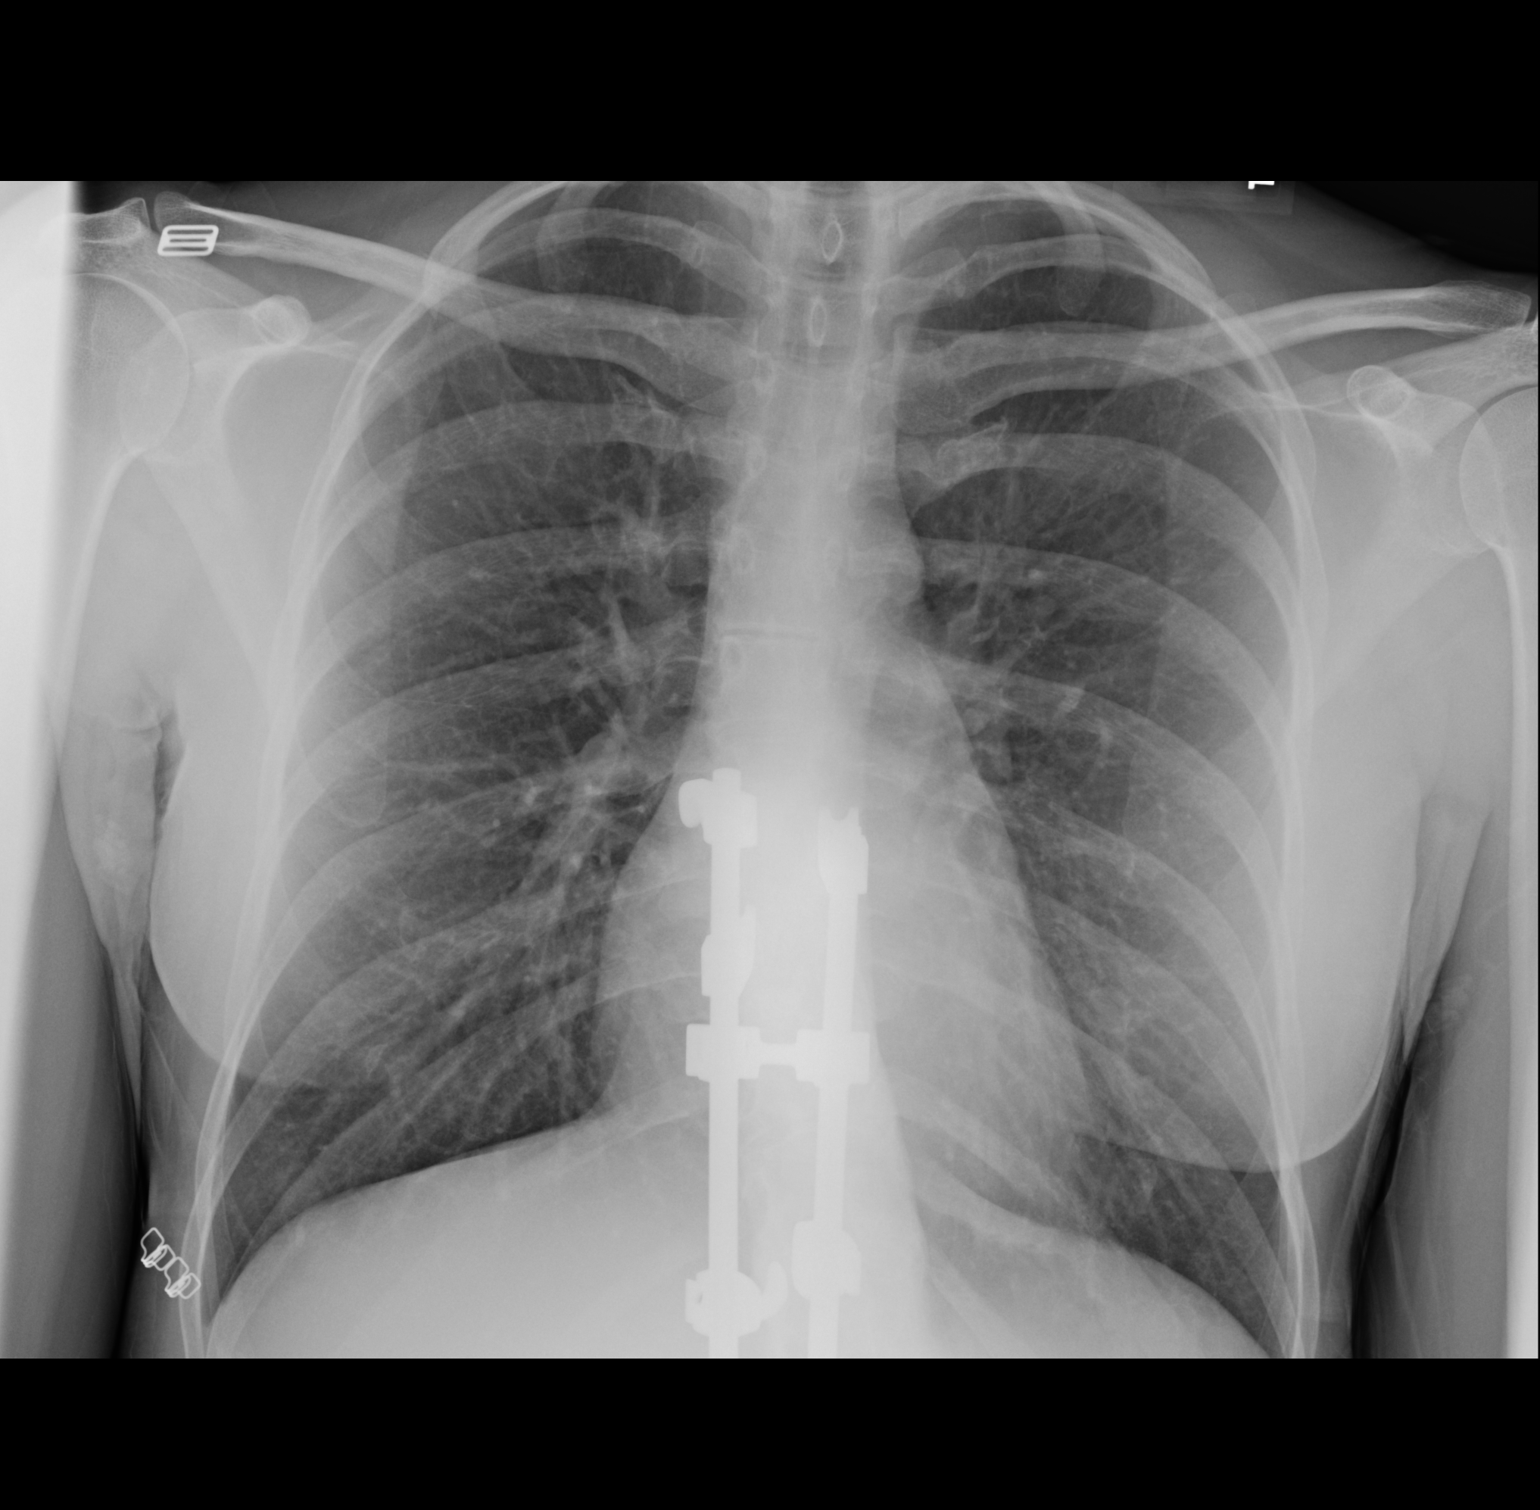
[im 2/2]
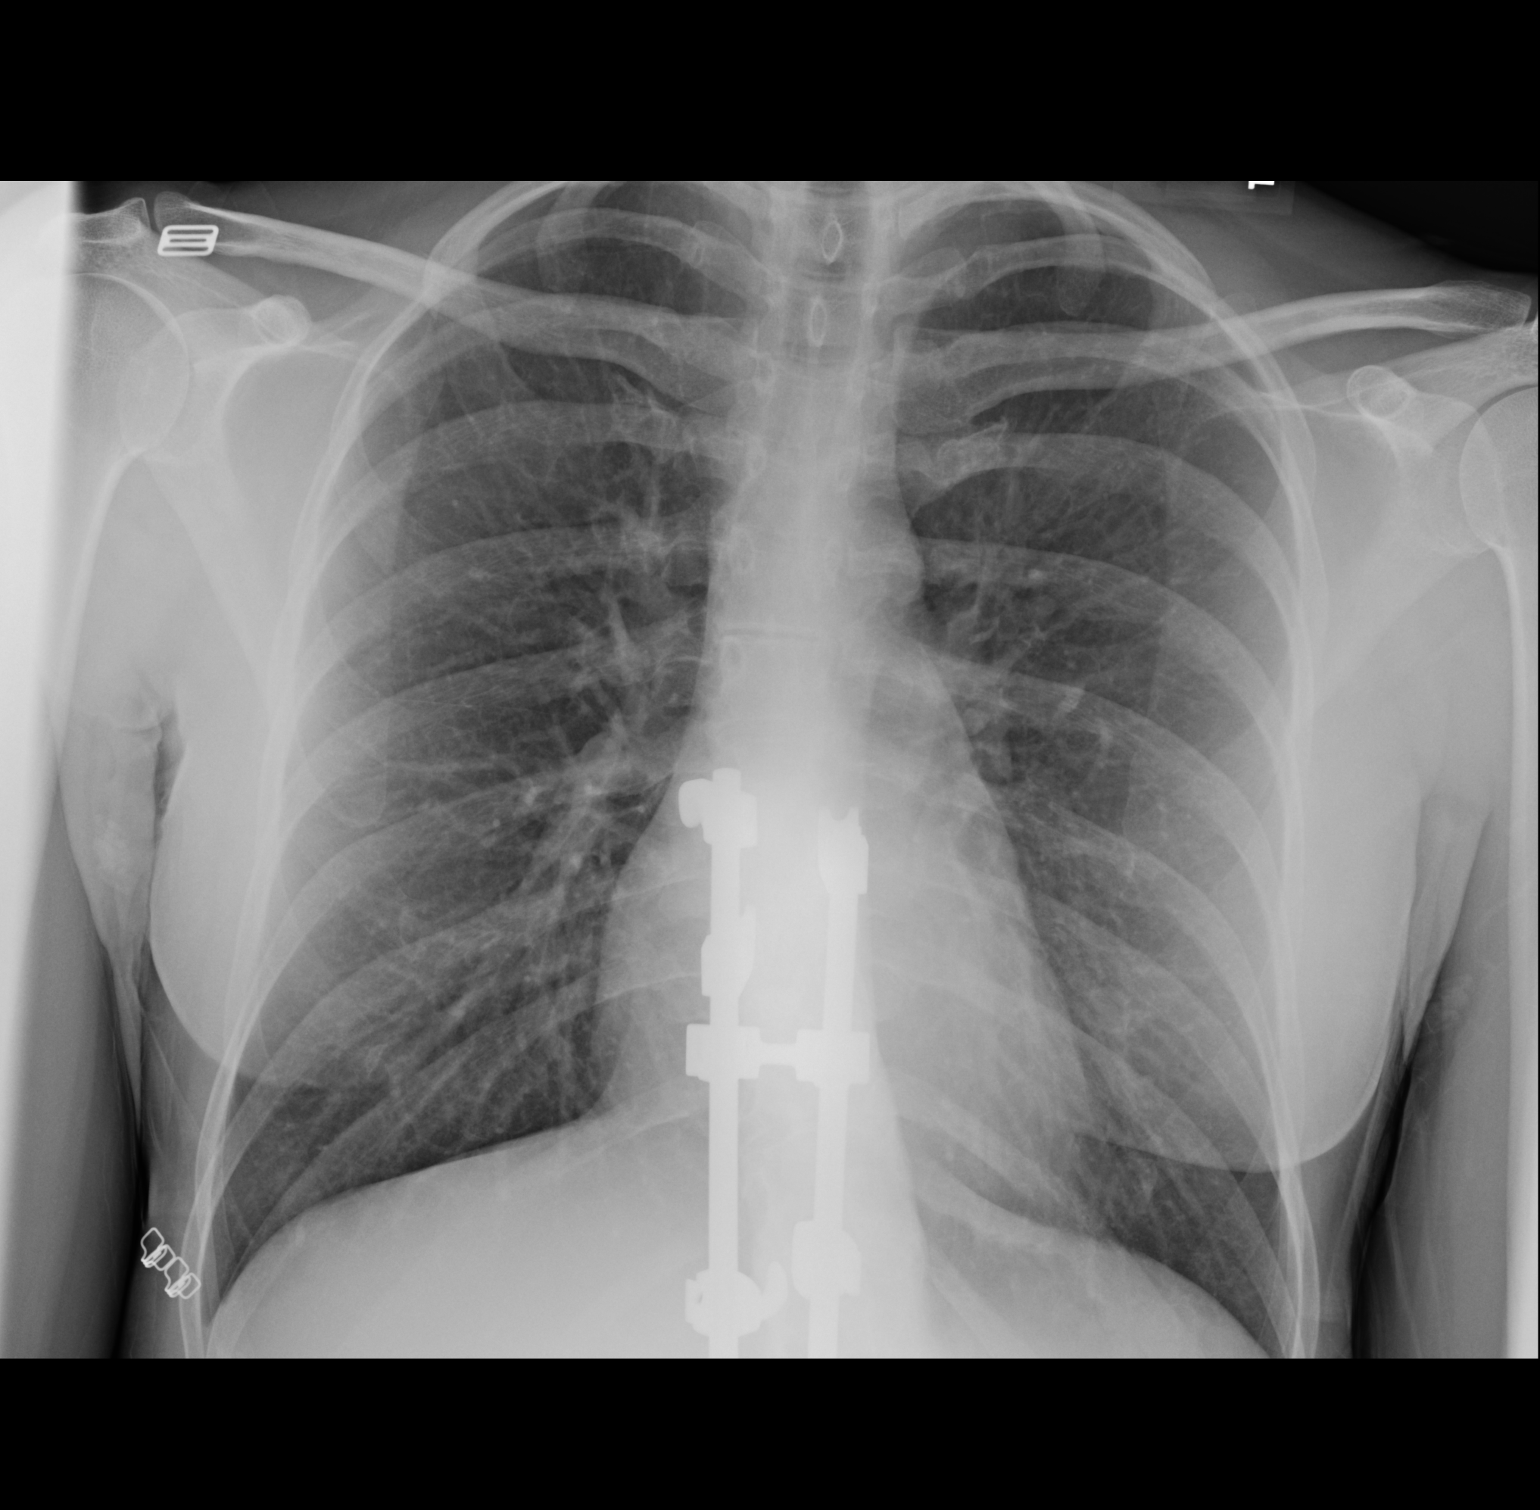

[2 of 2 positions shown; findings below may reference images not displayed]

FINDINGS: Mediastinum hilar structures normal. Lungs are clear. No pleural
effusion pneumothorax. Heart size normal. Prior thoracolumbar spine
fusion. Hardware intact.
IMPRESSION: No acute cardiopulmonary disease.

## 2021-06-14 ENCOUNTER — Telehealth: Payer: BC Managed Care – PPO | Admitting: Nurse Practitioner

## 2021-06-14 ENCOUNTER — Telehealth: Payer: Self-pay

## 2021-06-14 DIAGNOSIS — J324 Chronic pansinusitis: Secondary | ICD-10-CM | POA: Diagnosis not present

## 2021-06-14 DIAGNOSIS — J069 Acute upper respiratory infection, unspecified: Secondary | ICD-10-CM

## 2021-06-14 MED ORDER — DOXYCYCLINE HYCLATE 100 MG PO TABS: 100.0000 mg | ORAL_TABLET | Freq: Two times a day (BID) | ORAL | 0 refills | Status: AC

## 2021-06-14 MED ORDER — FLUTICASONE PROPIONATE 50 MCG/ACT NA SUSP: 2.0000 | Freq: Every day | NASAL | 0 refills | Status: DC

## 2021-06-14 NOTE — Addendum Note (Signed)
Addended by: Apolonio Schneiders E on: 06/14/2021 12:18 PM   Modules accepted: Orders

## 2021-06-14 NOTE — Progress Notes (Signed)
E-Visit for Sinus Problems  We are sorry that you are not feeling well.  Here is how we plan to help!  Based on what you have shared with me it looks like you have sinusitis.  Sinusitis is inflammation and infection in the sinus cavities of the head.  Based on your presentation I believe you most likely have Acute Bacterial Sinusitis.  This is an infection caused by bacteria and is treated with antibiotics. I have prescribed Doxycycline 100mg  by mouth twice a day for 10 days. You may use an oral decongestant such as Mucinex D or if you have glaucoma or high blood pressure use plain Mucinex. Saline nasal spray help and can safely be used as often as needed for congestion.  If you develop worsening sinus pain, fever or notice severe headache and vision changes, or if symptoms are not better after completion of antibiotic, please schedule an appointment with a health care provider.    Sinus infections are not as easily transmitted as other respiratory infection, however we still recommend that you avoid close contact with loved ones, especially the very young and elderly.  Remember to wash your hands thoroughly throughout the day as this is the number one way to prevent the spread of infection!  Home Care: Only take medications as instructed by your medical team. Complete the entire course of an antibiotic. Do not take these medications with alcohol. A steam or ultrasonic humidifier can help congestion.  You can place a towel over your head and breathe in the steam from hot water coming from a faucet. Avoid close contacts especially the very young and the elderly. Cover your mouth when you cough or sneeze. Always remember to wash your hands.  Get Help Right Away If: You develop worsening fever or sinus pain. You develop a severe head ache or visual changes. Your symptoms persist after you have completed your treatment plan.  Make sure you Understand these instructions. Will watch your  condition. Will get help right away if you are not doing well or get worse.  Thank you for choosing an e-visit.  Your e-visit answers were reviewed by a board certified advanced clinical practitioner to complete your personal care plan. Depending upon the condition, your plan could have included both over the counter or prescription medications.  Please review your pharmacy choice. Make sure the pharmacy is open so you can pick up prescription now. If there is a problem, you may contact your provider through [employer] and have the prescription routed to another pharmacy.  Your safety is important to [place]. If you have drug allergies check your prescription carefully.   For the next 24 hours you can use MyChart to ask questions about today's visit, request a non-urgent call back, or ask for a work or school excuse. You will get an email in the next two days asking about your experience. I hope that your e-visit has been valuable and will speed your recovery.  I spent approximately 10 minutes reviewing the patient's history, current symptoms and coordinating their care today.

## 2021-06-14 NOTE — Progress Notes (Signed)
E-Visit for Upper Respiratory Infection   We are sorry you are not feeling well.  Here is how we plan to help!  Based on what you have shared with me, it looks like you may have a viral upper respiratory infection.  Upper respiratory infections are caused by a large number of viruses; however, rhinovirus is the most common cause.   Your symptoms are similar to those of COVID-19 you should consider taking a home test if possible.   Providers prescribe antibiotics to treat infections caused by bacteria. Antibiotics are very powerful in treating bacterial infections when they are used properly. To maintain their effectiveness, they should be used only when necessary. Overuse of antibiotics has resulted in the development of superbugs that are resistant to treatment!    After careful review of your answers, I would not recommend an antibiotic for your condition.  Antibiotics are not effective against viruses and therefore should not be used to treat them. Common examples of infections caused by viruses include colds and flu and COVID-19. If your symptoms are present for longer than one week we would recommend a follow up for evaluation of a sinus infection at that time.   Symptoms vary from person to person, with common symptoms including sore throat, cough, fatigue or lack of energy and feeling of general discomfort.  A low-grade fever of up to 100.4 may present, but is often uncommon.  Symptoms vary however, and are closely related to a person's age or underlying illnesses.  The most common symptoms associated with an upper respiratory infection are nasal discharge or congestion, cough, sneezing, headache and pressure in the ears and face.  These symptoms usually persist for about 3 to 10 days, but can last up to 2 weeks.  It is important to know that upper respiratory infections do not cause serious illness or complications in most cases.    Upper respiratory infections can be transmitted from person to  person, with the most common method of transmission being a person's hands.  The virus is able to live on the skin and can infect other persons for up to 2 hours after direct contact.  Also, these can be transmitted when someone coughs or sneezes; thus, it is important to cover the mouth to reduce this risk.  To keep the spread of the illness at [place], good hand hygiene is very important.  This is an infection that is most likely caused by a virus. There are no specific treatments other than to help you with the symptoms until the infection runs its course.  We are sorry you are not feeling well.  Here is how we plan to help!   For nasal congestion, you may use an oral decongestants such as Mucinex D or if you have glaucoma or high blood pressure use plain Mucinex.  Saline nasal spray or nasal drops can help and can safely be used as often as needed for congestion.  For your congestion, I have prescribed Fluticasone nasal spray one spray in each nostril twice a day  If you do not have a history of heart disease, hypertension, diabetes or thyroid disease, prostate/bladder issues or glaucoma, you may also use Sudafed to treat nasal congestion.  It is highly recommended that you consult with a pharmacist or your primary care physician to ensure this medication is safe for you to take.     If you have a cough, you may use cough suppressants such as Delsym and Robitussin.  If you have  glaucoma or high blood pressure, you can also use Coricidin HBP.     If you have a sore or scratchy throat, use a saltwater gargle-  to  teaspoon of salt dissolved in a 4-ounce to 8-ounce glass of warm water.  Gargle the solution for approximately 15-30 seconds and then spit.  It is important not to swallow the solution.  You can also use throat lozenges/cough drops and Chloraseptic spray to help with throat pain or discomfort.  Warm or cold liquids can also be helpful in relieving throat pain.  For headache, pain or general  discomfort, you can use Ibuprofen or Tylenol as directed.   Some authorities believe that zinc sprays or the use of Echinacea may shorten the course of your symptoms.   HOME CARE Only take medications as instructed by your medical team. Be sure to drink plenty of fluids. Water is fine as well as fruit juices, sodas and electrolyte beverages. You may want to stay away from caffeine or alcohol. If you are nauseated, try taking small sips of liquids. How do you know if you are getting enough fluid? Your urine should be a pale yellow or almost colorless. Get rest. Taking a steamy shower or using a humidifier may help nasal congestion and ease sore throat pain. You can place a towel over your head and breathe in the steam from hot water coming from a faucet. Using a saline nasal spray works much the same way. Cough drops, hard candies and sore throat lozenges may ease your cough. Avoid close contacts especially the very young and the elderly Cover your mouth if you cough or sneeze Always remember to wash your hands.   GET HELP RIGHT AWAY IF: You develop worsening fever. If your symptoms do not improve within 10 days You develop yellow or green discharge from your nose over 3 days. You have coughing fits You develop a severe head ache or visual changes. You develop shortness of breath, difficulty breathing or start having chest pain Your symptoms persist after you have completed your treatment plan  MAKE SURE YOU  Understand these instructions. Will watch your condition. Will get help right away if you are not doing well or get worse.  Thank you for choosing an e-visit.  Your e-visit answers were reviewed by a board certified advanced clinical practitioner to complete your personal care plan. Depending upon the condition, your plan could have included both over the counter or prescription medications.  Please review your pharmacy choice. Make sure the pharmacy is open so you can pick up  prescription now. If there is a problem, you may contact your provider through [employer] and have the prescription routed to another pharmacy.  Your safety is important to [place]. If you have drug allergies check your prescription carefully.   For the next 24 hours you can use MyChart to ask questions about today's visit, request a non-urgent call back, or ask for a work or school excuse. You will get an email in the next two days asking about your experience. I hope that your e-visit has been valuable and will speed your recovery.  I spent approximately 7 minutes reviewing the patient's history, current symptoms and coordinating their plan of care today.

## 2021-06-24 ENCOUNTER — Ambulatory Visit: Payer: BC Managed Care – PPO

## 2021-07-20 ENCOUNTER — Ambulatory Visit (HOSPITAL_COMMUNITY)
Admission: EM | Admit: 2021-07-20 | Discharge: 2021-07-20 | Disposition: A | Discharge status: Home / Self Care | Payer: BC Managed Care – PPO

## 2021-07-20 DIAGNOSIS — F4323 Adjustment disorder with mixed anxiety and depressed mood: Secondary | ICD-10-CM

## 2021-07-20 NOTE — Discharge Instructions (Signed)
Please follow up with [facility].   Patient is instructed prior to discharge to: Take all medications as prescribed by his/her mental healthcare provider. Report any adverse effects and or reactions from the medicines to his/her outpatient provider promptly. Patient has been instructed & cautioned: To not engage in alcohol and or illegal drug use while on prescription medicines. In the event of worsening symptoms, patient is instructed to call the crisis hotline, 911 and or go to the nearest ED for appropriate evaluation and treatment of symptoms. To follow-up with his/her primary care provider for your other medical issues, concerns and or health care needs.

## 2021-07-20 NOTE — ED Notes (Signed)
Discharge instructions provided and Pt stated understanding. Pt alert, orient and ambulatory prior to d/c from facility. Safety maintained.   

## 2021-07-20 NOTE — Progress Notes (Signed)
°   07/20/21 1512  [place] (Walk-ins at [facility] only)  How Did You Hear About [place]? Family/Friend  What Is the Reason for Your Visit/Call Today? Pt is a 41 yo female who presented voluntarily accompanied by her parents due to worsening anxiety and depression symptoms. Pt reported she started a new job 06/23/21 in [place], after being at the same job for 20 years locally. Pt described ruminations over the vast difference in the two employers and over being in a new small town where she knows no one. 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 Pt denied SI, HI, NSSH, AVH, paranoia and drug/alcohol use. Pt reported she has not been sleeping well or eating much. Pt reported she is starting to have trouble making herself get out of bed to go to work. Pt described "swirling thoughts" and "constant worry." Pt stated she was seen a few days ago at a [facility] in [place]. Pt does not currently have OP psych providers but was seen for medication management and OP therapy in 2021. Additional atressors include the premature death of her twin brother by cancer about a year ago.  How Long Has This Been Causing You Problems? 1-6 months  Have You Recently Had Any Thoughts About Hurting Yourself? No  Are You Planning to Commit Suicide/Harm Yourself At This time? No  Have you Recently Had Thoughts About [place]? No  Are You Planning To Harm Someone At This Time? No  Are you currently experiencing any auditory, visual or other hallucinations? No  Have You Used Any Alcohol or Drugs in the Past 24 Hours? No  Do you have any current medical co-morbidities that require immediate attention? No  Clinician description of patient physical appearance/behavior: Pt was alert, oriented x 4, calm and cooperative. Pt was noticeably anxious and was wringing her hands almost  continuously, Pt's mood was anxious and depressed and her flat affect was congruent. Pt's insight and judgment are fair.  What Do You Feel Would Help You the Most Today? Treatment for Depression or other mood problem;Medication(s)  Determination of Need Routine (7 days) (Per Thomes Lolling NP, pt is psych cleared. Recommended initiating OP therapy in [place] with the help of a referral by the local [place] there. Also, recommended evaluation for psych medication.)  Options For Referral Medication Management;Outpatient Therapy   Aeriana Speece T. Mare Ferrari, [place], [facility], [facility] Triage Specialist [facility]

## 2021-07-20 NOTE — ED Provider Notes (Signed)
Behavioral Health Urgent Care Medical Screening Exam  Patient Name: Kathy Graham MRN: [id number] Date of Evaluation: 07/20/21 Chief Complaint:   Diagnosis:  Final diagnoses:  Adjustment disorder with mixed anxiety and depressed mood    History of Present illness: Kathy Graham is a 41 y.o. female patient presented to [facility] as a walk in with her parents with complaints of "I need help with my anxiety".  Kathy Graham, 41 y.o., female patient seen face to face by this provider, consulted with Dr. Dwyane Dee; and chart reviewed on 07/20/21.  Per patient she has been diagnosed with MDD and anxiety in the past. She has been prescribed Zoloft (one dose made her vomit), lexapro (one dose made her feel like she was disassociating), Clonopin (makes her mouth to dry), and Hydroxyzine (made her feel very thirsty).  Patient reports she recently quit her job of 20 years because she felt like her job was in jeopardy due to company possibly closing down.  She took a job and [employer] which is 2 hours away.  She started her new position June 22 2021.  Before she moved she was living with her parents and now she is living alone in [place].  Reports things are not what she thought they would be. She feels misled with the job itself and there is little to do in the small town.  Her parents have been staying with her since she moved.  Patient feels like she made a horrible decision changing her job.  She asked, "do I need to quit my new job".States she loved her old job and her job family. She misses the old job and has contacted her old employer, but her job has already been filled.   During evaluation Kathy Graham is in sitting position in no acute distress. She is anxious and wringing her wrist. She is alert/oriented x 4 and cooperative. She is speaking in a clear tone at moderate volume, and normal pace; with good eye contact. She lost her twin brother to cancer 9 years ago. Since that time she  has had depression off and on, she was diagnosed during [place]. She tried getting a puppy but state that only lasted one week. Her anxiety and depression have intensified since she started her job in January 2023. She endorses decreased sleep. She did not sleep any on Thursday. She visited a walk in crisis center that provides same day counseling and was prescribed ambian 5 mg (x5 tabs). Reports she has not taken medication, she is afraid to take it. She reports a decrease in appetite since starting her new job. Her thought process is coherent and relevant; There is no indication that she is currently responding to internal/external stimuli or experiencing delusional thought content; and she has denied suicidal/self-harm/homicidal ideation, psychosis, and paranoia. Patient answered questions appropriately.    Discussed the importance of obtaining an outpatient psychiatric provider for medication management and therapy. Patient states she thought she coming to [facility] today for a walk in therapy appointment.   At this time Kathy Graham is educated and verbalizes understanding of mental health resources and other crisis services in the community. She is instructed to call 911 and present to the nearest emergency room should she experience any suicidal/homicidal ideation, auditory/visual/hallucinations, or detrimental worsening of her mental health condition. She was a also advised by Probation officer that she could call the toll-free phone on insurance card to assist with identifying in network counselors and agencies or number  on back of insurance card.    Psychiatric Specialty Exam  Presentation  General Appearance:Appropriate for Environment; Fairly Groomed  Eye Contact:Good  Speech:Clear and Coherent; Normal Rate  Speech Volume:Normal  Handedness:Right   Mood and Affect  Mood:Anxious  Affect:Congruent   Thought Process  Thought Processes:Coherent  Descriptions of  Associations:Intact  Orientation:Full (Time, Place and Person)  Thought Content:Logical    Hallucinations:None  Ideas of Reference:None  Suicidal Thoughts:No  Homicidal Thoughts:No   Sensorium  Memory:Immediate Good; Remote Good; Recent Good  Judgment:Good  Insight:Good   Executive Functions  Concentration:Good  Attention Span:Good  [place]  Language:Good   Psychomotor Activity  Psychomotor Activity:Normal   Assets  Assets:Communication Skills; Desire for Improvement; Financial Resources/Insurance; Housing; Physical Health; Resilience; Social Support; Vocational/Educational; Transportation   Sleep  Sleep:Poor  Number of hours: No data recorded  No data recorded  Physical Exam: Physical Exam Vitals and nursing note reviewed.  Constitutional:      General: She is not in acute distress.    Appearance: Normal appearance. She is not ill-appearing.  HENT:     Head: Normocephalic.  Eyes:     General:        Right eye: No discharge.        Left eye: No discharge.     Conjunctiva/sclera: Conjunctivae normal.  Cardiovascular:     Rate and Rhythm: Normal rate.  Pulmonary:     Effort: Pulmonary effort is normal. No respiratory distress.  Musculoskeletal:        General: Normal range of motion.     Cervical back: Normal range of motion.  Skin:    Coloration: Skin is not jaundiced or pale.  Neurological:     Mental Status: She is alert and oriented to person, place, and time.  Psychiatric:        Attention and Perception: Attention and perception normal.        Mood and Affect: Mood is anxious and depressed.        Speech: Speech normal.        Behavior: Behavior normal. Behavior is cooperative.        Thought Content: Thought content normal.        Cognition and Memory: Cognition normal.        Judgment: Judgment normal.   Review of Systems  Constitutional: Negative.   HENT: Negative.    Eyes: Negative.    Respiratory: Negative.    Cardiovascular: Negative.   Musculoskeletal: Negative.   Skin: Negative.   Neurological: Negative.   Psychiatric/Behavioral:  Positive for depression. The patient is nervous/anxious.   Blood pressure 121/73, pulse 82, temperature 98.2 F (36.8 C), temperature source Oral, resp. rate 16, height 5\' 3"  (1.6 m), weight 118 lb (53.5 kg), SpO2 100 %. Body mass index is 20.9 kg/m.  Musculoskeletal: Strength & Muscle Tone: within normal limits Gait & Station: normal Patient leans: N/A   [place] MSE Discharge Disposition for Follow up and Recommendations: Based on my evaluation the patient does not appear to have an emergency medical condition and can be discharged with resources and follow up care in outpatient services for Medication Management and Individual Therapy  Discharge patient   Patient has a follow up appt at the crisis center  in [place] where she resides. She was provided with a list of providers from this crisis center in that area. Educated patient on therapy and medication management and the importance to obtain.   No evidence of imminent risk  to self or others at present.    Patient does not meet criteria for psychiatric inpatient admission. Discussed crisis plan, support from social network, calling 911, coming to the Emergency Department, and calling Suicide Hotline.   Revonda Humphrey, NP 07/20/2021, 3:20 PM

## 2021-07-27 ENCOUNTER — Other Ambulatory Visit: Payer: Self-pay

## 2021-07-27 ENCOUNTER — Encounter: Payer: Self-pay | Admitting: Emergency Medicine

## 2021-07-27 ENCOUNTER — Ambulatory Visit
Admission: EM | Admit: 2021-07-27 | Discharge: 2021-07-27 | Disposition: A | Discharge status: Home / Self Care | Payer: BC Managed Care – PPO

## 2021-07-27 DIAGNOSIS — G4700 Insomnia, unspecified: Secondary | ICD-10-CM

## 2021-07-27 DIAGNOSIS — F411 Generalized anxiety disorder: Secondary | ICD-10-CM

## 2021-07-27 DIAGNOSIS — B370 Candidal stomatitis: Secondary | ICD-10-CM

## 2021-07-27 MED ORDER — TIZANIDINE HCL 4 MG PO TABS: 4.0000 mg | ORAL_TABLET | Freq: Every day | ORAL | 0 refills | Status: DC

## 2021-07-27 MED ORDER — CLOTRIMAZOLE 10 MG MT TROC: 10.0000 mg | Freq: Every day | OROMUCOSAL | 0 refills | Status: AC

## 2021-07-27 NOTE — ED Provider Notes (Signed)
[place]   MRN: [id number] DOB: [birth year]  Subjective:   Kathy Graham is a 41 y.o. female presenting for 1 month history of persistent difficulty with insomnia.  Patient was recently started on buspirone and has been doing well with that during the day for anxiety.  Unfortunately at night she is done worse with trazodone.  She ends up falling asleep but after 2 to 3 hours wakes up and feels very short of breath, shaky and fearful.  She has previously had a lot of difficulty with her anxiety and has a lot of difficulty with change.  She did recently start a new job and is undergoing a lot of stress.  She can follow-up with her mental health provider this upcoming week but wanted to get help now.  No SI, HI.  She also has concerns about a geographic tongue.  No current facility-administered medications for this encounter.  Current Outpatient Medications:    clonazePAM (KLONOPIN) 0.5 MG tablet, Take 1/2 to 1 tab p.o. daily prn anxiety (Patient not taking: Reported on 01/09/2021), Disp: 20 tablet, Rfl: 0   drospirenone-ethinyl estradiol (YAZ,GIANVI,LORYNA) 3-0.02 MG tablet, Take 1 tablet by mouth daily., Disp: , Rfl:    fexofenadine (ALLEGRA) 180 MG tablet, Take 180 mg by mouth daily., Disp: , Rfl:    fluticasone (FLONASE) 50 MCG/ACT nasal spray, Place 2 sprays into both nostrils daily., Disp: 16 g, Rfl: 0   Multiple Vitamin (MULTIVITAMIN WITH MINERALS) TABS tablet, Take 1 tablet by mouth daily., Disp: , Rfl:    Oxymetazoline HCl (NASAL DECONGESTANT SPRAY NA), Place 2 puffs into the nose daily. , Disp: , Rfl:    pantoprazole (PROTONIX) 40 MG tablet, TAKE 1 TABLET DAILY BEFORE BREAKFAST, Disp: 90 tablet, Rfl: 1   triamcinolone (NASACORT ALLERGY 24HR) 55 MCG/ACT AERO nasal inhaler, Place 2 sprays into the nose daily., Disp: 1 each, Rfl: 3   Allergies  Allergen Reactions   Augmentin [Amoxicillin-Pot Clavulanate] Nausea And Vomiting   Cefzil [Cefprozil]     Serum sickness     Past Medical History:  Diagnosis Date   Reflux      Past Surgical History:  Procedure Laterality Date   BACK SURGERY     BIOPSY  12/16/2018   Procedure: BIOPSY;  Surgeon: Danie Binder, MD;  [facility];  Service: Endoscopy;;  esophagus gastric    COLONOSCOPY N/A 10/26/2019   Procedure: COLONOSCOPY;  Surgeon: Danie Binder, MD;  [facility]: [facility];  Service: Endoscopy;  Laterality: N/A;  930am   ESOPHAGOGASTRODUODENOSCOPY N/A 12/16/2018   Dr. Oneida Alar: Patchy mild inflammation characterized by congestion and erythema in the gastric body and gastric antrum.  Web found in the distal esophagus status post dilation.  Gastric biopsies showed gastritis but no H. pylori.  Esophageal biopsy showed reflux.   POLYPECTOMY  10/26/2019   Procedure: POLYPECTOMY;  Surgeon: Danie Binder, MD;  [facility];  Service: Endoscopy;;  descending    Family History  Problem Relation Age of Onset   Hyperlipidemia Mother    Colon polyps Mother        before age 86   Hypothyroidism Mother    Hypertension Father    Colon polyps Father        before age 43   Cancer Brother 91       melanoma   Colon cancer Other     Social History   Tobacco Use   Smoking status: Never   Smokeless tobacco: Never  Substance Use Topics   Alcohol use: Yes    Comment: occ glass of wine   Drug use: No    ROS   Objective:   Vitals: BP 134/82 (BP Location: Right Arm)    Pulse 84    Temp 98.1 F (36.7 C) (Oral)    Ht 5\' 3"  (1.6 m)    Wt 118 lb (53.5 kg)    SpO2 98%    BMI 20.90 kg/m   Physical Exam Constitutional:      General: She is not in acute distress.    Appearance: Normal appearance. She is well-developed. She is not ill-appearing, toxic-appearing or diaphoretic.  HENT:     Head: Normocephalic and atraumatic.     Comments: No facial swelling.    Nose: Nose normal.     Mouth/Throat:     Mouth: Mucous membranes are moist.     Comments: Geographic tongue noted.  Airway  is otherwise patent, no swelling. Eyes:     General: No scleral icterus.       Right eye: No discharge.        Left eye: No discharge.     Extraocular Movements: Extraocular movements intact.  Cardiovascular:     Rate and Rhythm: Normal rate.     Heart sounds: No murmur heard.   No friction rub. No gallop.  Pulmonary:     Effort: Pulmonary effort is normal. No respiratory distress.     Breath sounds: No stridor. No wheezing, rhonchi or rales.  Chest:     Chest wall: No tenderness.  Skin:    General: Skin is warm and dry.  Neurological:     General: No focal deficit present.     Mental Status: She is alert and oriented to person, place, and time.  Psychiatric:        Behavior: Behavior normal.        Thought Content: Thought content normal.        Judgment: Judgment normal.     Comments: Anxious demeanor.  Flat affect.  No suicidal or homicidal ideation.    Assessment and Plan :   PDMP not reviewed this encounter.  1. Generalized anxiety disorder   2. Oral thrush   3. Insomnia, unspecified type    Recommended maintaining buspirone.  Stop trazodone.  Patient has previously failed hydroxyzine.  I offered her tizanidine as she currently has clonazepam but is hesitant to use it.  Recommended very close follow-up with her mental health provider.  Use clotrimazole troches for the oral thrush. Counseled patient on potential for adverse effects with medications prescribed/recommended today, ER and return-to-clinic precautions discussed, patient verbalized understanding.    Jaynee Eagles, PA-C 07/27/21 1328

## 2021-07-27 NOTE — Discharge Instructions (Addendum)
Stop taking trazodone. Continue taking Buspar. Use tizanidine for now to see if this doesn't help you sleep. It is not an addictive medication and therefore should be safer to use long term as opposed to clonazepam. I do not recommend using tizanidine and clonazepam together.

## 2021-07-27 NOTE — ED Triage Notes (Signed)
Patient c/o possible allergic reaction to new medication started.  Patient was started Buspirone 7.5mg  during the day and Trazodone 50mg  at night.  She just started the medications on Friday.  A lot of anxiety and very anxious, new life changes and stressors, new job, new location.

## 2021-07-29 ENCOUNTER — Telehealth: Payer: BC Managed Care – PPO | Admitting: Physician Assistant

## 2021-07-29 DIAGNOSIS — B9689 Other specified bacterial agents as the cause of diseases classified elsewhere: Secondary | ICD-10-CM

## 2021-07-29 DIAGNOSIS — J019 Acute sinusitis, unspecified: Secondary | ICD-10-CM | POA: Diagnosis not present

## 2021-07-29 MED ORDER — DOXYCYCLINE HYCLATE 100 MG PO TABS: 100.0000 mg | ORAL_TABLET | Freq: Two times a day (BID) | ORAL | 0 refills | Status: DC

## 2021-07-29 NOTE — Progress Notes (Signed)

## 2021-07-29 NOTE — Progress Notes (Signed)
I have spent 5 minutes in review of e-visit questionnaire, review and updating patient chart, medical decision making and response to patient.   Jasani Lengel Cody Katyra Tomassetti, PA-C    

## 2021-08-06 ENCOUNTER — Telehealth (HOSPITAL_COMMUNITY): Payer: Self-pay | Admitting: Family Medicine

## 2021-08-06 NOTE — BH Assessment (Signed)
Care Management - [place] Follow Up Discharges   Writer attempted to make contact with patient today and was unsuccessful.  Writer left a HIPPA compliant voice message.   Per chart review, patient will follow up with outpatient therapy in [facility] with the help of a referral by the local [place] there.

## 2021-08-12 ENCOUNTER — Ambulatory Visit: Payer: Self-pay | Admitting: Family Medicine

## 2021-08-12 ENCOUNTER — Ambulatory Visit (INDEPENDENT_AMBULATORY_CARE_PROVIDER_SITE_OTHER): Payer: BC Managed Care – PPO | Admitting: Family Medicine

## 2021-08-12 ENCOUNTER — Other Ambulatory Visit: Payer: Self-pay

## 2021-08-12 DIAGNOSIS — F4323 Adjustment disorder with mixed anxiety and depressed mood: Secondary | ICD-10-CM | POA: Diagnosis not present

## 2021-08-12 MED ORDER — BUPROPION HCL ER (XL) 150 MG PO TB24: 150.0000 mg | ORAL_TABLET | Freq: Every day | ORAL | 0 refills | Status: DC

## 2021-08-12 NOTE — Patient Instructions (Signed)
I have sent in Wellbutrin.  I am going to place referral for a counselor for you.  Continue Buspar.  Follow up in ~ 6 weeks.

## 2021-08-12 NOTE — Progress Notes (Signed)
Subjective:  Patient ID: Kathy Graham, female    DOB: [birth year]  Age: 41 y.o. MRN: [id number]  CC: Chief Complaint  Patient presents with   Depression    HPI:  41 year old female presents for evaluation of the above.  Patient states that she recently changed jobs and moved away to [place].  She states that her family is here locally.  Patient states that the job is not what she expected and she is struggling.  She states that she is suffering from anxiety and feels like she is suffering from depression as well.  She is very dissatisfied at work.  She feels like she was misled regarding her job.  She is eager to move back home.  Patient currently very tearful and very upset.  She states that her symptoms are uncontrolled.  Is having difficulty sleeping.  Anxiety is quite high.  She has had improvement with BuSpar but is still struggling.  PHQ-9 score of 24.  Patient states that she has been on medication for anxiety and depression previously.  She would like to discuss this today.  Patient Active Problem List   Diagnosis Date Noted   Adjustment reaction with anxiety and depression 08/12/2021   Colon cancer screening    GERD (gastroesophageal reflux disease) 04/28/2019   FH: colon polyps 04/28/2019   Esophageal dysphagia     Social Hx   Social History   Socioeconomic History   Marital status: Unknown    Spouse name: Not on file   Number of children: Not on file   Years of education: Not on file   Highest education level: Not on file  Occupational History   Not on file  Tobacco Use   Smoking status: Never   Smokeless tobacco: Never  Vaping Use   Vaping Use: Not on file  Substance and Sexual Activity   Alcohol use: Yes    Comment: occ glass of wine   Drug use: No   Sexual activity: Not Currently    Birth control/protection: Pill  Other Topics Concern   Not on file  Social History Narrative   Not on file   Social Determinants of Health   Financial Resource  Strain: Not on file  Food Insecurity: Not on file  Transportation Needs: Not on file  Physical Activity: Not on file  Stress: Not on file  Social Connections: Not on file    Review of Systems Per HPI  Objective:  BP 112/72    Pulse 75    Temp (!) 97.4 F (36.3 C)    Ht 5\' 3"  (1.6 m)    Wt 122 lb (55.3 kg)    SpO2 100%    BMI 21.61 kg/m   BP/Weight 08/12/2021 07/27/2021 9/44/9675  Systolic BP 916 384 665  Diastolic BP 72 82 70  Wt. (Lbs) 122 118 -  BMI 21.61 20.9 -  Some encounter information is confidential and restricted. Go to Review Flowsheets activity to see all data.    Physical Exam Constitutional:      General: She is not in acute distress.    Appearance: Normal appearance.  HENT:     Head: Normocephalic and atraumatic.  Cardiovascular:     Rate and Rhythm: Normal rate and regular rhythm.  Pulmonary:     Effort: Pulmonary effort is normal.     Breath sounds: Normal breath sounds.  Neurological:     Mental Status: She is alert.  Psychiatric:     Comments: Anxious.  Tearful.  Lab Results  Component Value Date   WBC 7.4 09/05/2019   HGB 12.5 09/05/2019   HCT 38.3 09/05/2019   PLT 286 09/05/2019   GLUCOSE 81 09/05/2019   CHOL 194 09/05/2019   TRIG 149 09/05/2019   HDL 72 09/05/2019   LDLCALC 97 09/05/2019   ALT 15 09/05/2019   AST 15 09/05/2019   NA 139 09/05/2019   K 4.6 09/05/2019   CL 103 09/05/2019   CREATININE 0.70 09/05/2019   BUN 10 09/05/2019   CO2 23 09/05/2019   TSH 3.200 09/05/2019     Assessment & Plan:   Problem List Items Addressed This Visit       Other   Adjustment reaction with anxiety and depression    Referring to counseling.  Continue BuSpar.  Starting Wellbutrin.  Follow-up in 6 weeks.       Meds ordered this encounter  Medications   buPROPion (WELLBUTRIN XL) 150 MG 24 hr tablet    Sig: Take 1 tablet (150 mg total) by mouth daily.    Dispense:  90 tablet    Refill:  0    Follow-up:  Return in about 6 weeks  (around 09/23/2021).  [place]

## 2021-08-12 NOTE — Assessment & Plan Note (Signed)
Referring to counseling.  Continue BuSpar.  Starting Wellbutrin.  Follow-up in 6 weeks.

## 2021-08-25 ENCOUNTER — Encounter: Payer: Self-pay | Admitting: Family Medicine

## 2021-08-28 ENCOUNTER — Telehealth: Payer: BC Managed Care – PPO | Admitting: Physician Assistant

## 2021-08-28 DIAGNOSIS — J019 Acute sinusitis, unspecified: Secondary | ICD-10-CM

## 2021-08-28 DIAGNOSIS — B9689 Other specified bacterial agents as the cause of diseases classified elsewhere: Secondary | ICD-10-CM | POA: Diagnosis not present

## 2021-08-28 MED ORDER — AZITHROMYCIN 250 MG PO TABS: ORAL_TABLET | ORAL | 0 refills | Status: AC

## 2021-08-28 NOTE — Progress Notes (Signed)
E-Visit for Sinus Problems ? ?We are sorry that you are not feeling well.  Here is how we plan to help! ? ?Based on what you have shared with me it looks like you have sinusitis.  Sinusitis is inflammation and infection in the sinus cavities of the head.  Based on your presentation I believe you most likely have Acute Bacterial Sinusitis.  This is an infection caused by bacteria and is treated with antibiotics. I have prescribed Z-pack (Azithromycin). You may use an oral decongestant such as Mucinex D or if you have glaucoma or high blood pressure use plain Mucinex. Saline nasal spray help and can safely be used as often as needed for congestion.  If you develop worsening sinus pain, fever or notice severe headache and vision changes, or if symptoms are not better after completion of antibiotic, please schedule an appointment with a health care provider.   ? ?Sinus infections are not as easily transmitted as other respiratory infection, however we still recommend that you avoid close contact with loved ones, especially the very young and elderly.  Remember to wash your hands thoroughly throughout the day as this is the number one way to prevent the spread of infection! ? ?Home Care: ?Only take medications as instructed by your medical team. ?Complete the entire course of an antibiotic. ?Do not take these medications with alcohol. ?A steam or ultrasonic humidifier can help congestion.  You can place a towel over your head and breathe in the steam from hot water coming from a faucet. ?Avoid close contacts especially the very young and the elderly. ?Cover your mouth when you cough or sneeze. ?Always remember to wash your hands. ? ?Get Help Right Away If: ?You develop worsening fever or sinus pain. ?You develop a severe head ache or visual changes. ?Your symptoms persist after you have completed your treatment plan. ? ?Make sure you ?Understand these instructions. ?Will watch your condition. ?Will get help right away if  you are not doing well or get worse. ? ?Thank you for choosing an e-visit. ? ?Your e-visit answers were reviewed by a board certified advanced clinical practitioner to complete your personal care plan. Depending upon the condition, your plan could have included both over the counter or prescription medications. ? ?Please review your pharmacy choice. Make sure the pharmacy is open so you can pick up prescription now. If there is a problem, you may contact your provider through [employer] and have the prescription routed to another pharmacy.  Your safety is important to [place]. If you have drug allergies check your prescription carefully.  ? ?For the next 24 hours you can use MyChart to ask questions about today's visit, request a non-urgent call back, or ask for a work or school excuse. ?You will get an email in the next two days asking about your experience. I hope that your e-visit has been valuable and will speed your recovery. ? ?I provided 5 minutes of non face-to-face time during this encounter for chart review and documentation.  ? ?

## 2021-09-08 ENCOUNTER — Other Ambulatory Visit: Payer: Self-pay

## 2021-09-08 ENCOUNTER — Ambulatory Visit (INDEPENDENT_AMBULATORY_CARE_PROVIDER_SITE_OTHER): Payer: BC Managed Care – PPO | Admitting: Licensed Clinical Social Worker

## 2021-09-08 ENCOUNTER — Encounter (HOSPITAL_COMMUNITY): Payer: Self-pay

## 2021-09-08 DIAGNOSIS — F323 Major depressive disorder, single episode, severe with psychotic features: Secondary | ICD-10-CM

## 2021-09-08 NOTE — Progress Notes (Signed)
I connected with Kathy Graham at 2 p.m. EST by a video enabled telemedicine application and verified that I am speaking with the correct person using three identifiers. ? ?Kathy Graham is in her car in [place]. She says that she works and lives in [place]. She has an apartment, but comes home to her parents' home in [place], [place] on weekends. On one of these weekends, she was seen by a PCP at an office in [place] and referred for an appointment at this office as the provider apparently did not understand that her residence is currently in [place]. ? ?This therapist explains to her that he cannot provider therapy to her via tele-health unless she is physically located in [place] where is he licensed. He also informs her that he does in-person visits in [place] as well. ? ?Kathy Graham says that up until about three months ago that she lived her entire life in [place], [place] and worked at the same company for 20 years. She lost her brother to cancer 9 years ago. During COVID, her company's business was booming such that she was working 11-12 hours per day. 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 Consequently, she ended up having more work put on her as she was being groomed to be his replacement while at the same time not receiving much of an increase in pay. ? ?She started feeling resentful so took a job with [employer] two hours away in [place] with [place] paying to relocate her. She says that [employer] misrepresented the job telling her that it was 8 to 5; however, she is on call on weekends and the job is extremely stressful. She says that she cries on the way to work and when at home and has only been getting about 3 hours of sleep per night. Her PHQ-9 score at her doctor's visit in [place] was a 59. She denies any suicidal or homicidal ideation or history of substance abuse.  ? ?As she could not tolerate an array of anti-depressants in the past,  a Nurse Practitioner she sees in [place] put her on Buspar which is helping some and has talked to her about getting the Gen Psych testing to see what anti-depressants she may tolerate. She says that her parents, best friend, and co-workers from her former job are all supports.  ? ?She concludes that her options are trying to stick with the current job while looking for a new job, trying to make it until April 4th when she is eligible to take a medical leave and going on leave, or putting in her notice at this job and taking a sabbatical to recover from the depression and look for a new job. She says that she is in the financial position that she could do this as she has been very frugal with money and never took more than one vacation at a time during all of her years working for the [employer]. ? ?She has spoken to her old company about returning but they currently do not have a position and are in the process of being bought out by another company. She has a friend who is trying to get her a job where he works in [place] but is driving distance from [place].  ? ?As she talks about long wait times to see therapists in [place], the therapist asks if she has spoken to a Music therapist at [facility] about this which she has not. He  explains that there are several, national tele-BH providers in her State that could likely get her in with a therapist and/or a psychiatrist within a week for tele-BH visits and that she can call and speak to a Health Concierge at [employer] to get connected with these providers.  ? ?The therapist explains to her that if she moves back to [place] or his physically located in [place] that he can see her for therapy either in-person or via tele-health.  ? ?She says that she is likely going to make a decision about her job and living situation this week noting that it will either be waiting until she can take a medical leave or putting in her notice as she does not believe  she can do this job and look for another job and she is apparently not doing her current job fast enough which is not surprising given her PHQ-9 score with her lack of sleep and poor concentration. ? ?The therapist provides her with his direct contact number, [phone], encouraging her to call him once she has made a decision if she has any questions or concerns about getting connected to another provider or another appointment with this therapist. ? ?Adam Phenix, MA, LCSW, [facility], LCAS ? ? ? ?

## 2021-09-09 ENCOUNTER — Telehealth (HOSPITAL_COMMUNITY): Payer: Self-pay | Admitting: Licensed Clinical Social Worker

## 2021-09-09 NOTE — Telephone Encounter (Signed)
Adam Phenix, MA, LCSW, [place], LCAS ?

## 2021-09-09 NOTE — Telephone Encounter (Signed)
The therapist receives the following email from [place]: ? ?"[email or web address] ?Steele Sizer ?*Caution - External email - see footer for warnings* ? ?  ?Hi Kathy Graham, ? ?It was nice to speak with you today.  I noticed while looking at the after summary report that you classified the depression as having psychotic features?  Can you explain this?  I don?t agree with this--short of beating myself up for the decision I?m not seeing/thinking/hearing anything unreal or irrational. ? ?Thanks, ? ?Kathy Graham" ? ?The therapist responds with the following, [place] email: ? ?"Kristien Salatino ?[email or web address] ?Good morning [name],  ? ?My apologies; as I noted, I have only been at [facility] since January 30th so am still working on becoming proficient in [place] record. ? ?I specifically intended on selecting Major Depression without psychotic features as psychotic features would most definitely not apply to you. ? ?This morning, I was able to go in and change it to the correct diagnosis. Thank you very much for calling this to my attention! ? ?If you end up staying in [place] and run into any difficulties getting connected to a provider, please call me at [phone] if you are in need of additional assistance. ? ?Sincerely,  ? ? ?Adam Phenix, MA, LCSW, [place], LCAS ?Counselor ?[facility] ?Office- [phone] ?Fax- [phone]" ? ?Adam Phenix, MA, LCSW, [place], LCAS ?

## 2021-09-19 ENCOUNTER — Ambulatory Visit: Payer: Self-pay

## 2021-09-19 ENCOUNTER — Ambulatory Visit
Admission: EM | Admit: 2021-09-19 | Discharge: 2021-09-19 | Disposition: A | Discharge status: Home / Self Care | Payer: BC Managed Care – PPO | Attending: Urgent Care | Admitting: Urgent Care

## 2021-09-19 DIAGNOSIS — F419 Anxiety disorder, unspecified: Secondary | ICD-10-CM

## 2021-09-19 DIAGNOSIS — G479 Sleep disorder, unspecified: Secondary | ICD-10-CM | POA: Diagnosis not present

## 2021-09-19 DIAGNOSIS — G4700 Insomnia, unspecified: Secondary | ICD-10-CM | POA: Diagnosis not present

## 2021-09-19 DIAGNOSIS — F410 Panic disorder [episodic paroxysmal anxiety] without agoraphobia: Secondary | ICD-10-CM | POA: Diagnosis not present

## 2021-09-19 MED ORDER — CYCLOBENZAPRINE HCL 10 MG PO TABS: 10.0000 mg | ORAL_TABLET | Freq: Every evening | ORAL | 0 refills | Status: DC | PRN

## 2021-09-19 NOTE — Discharge Instructions (Addendum)
Burnard Leigh, ACSW, LCSW ?Posted on December 29, 2014 ?Burnard Leigh, LCSW, LMFT, CTS, has offered individual, couple (both [place]- and Same-sex) and family therapy for more than 30 years.  A licensed marriage and family therapist, a certified trauma specialist and a full member of [employer] (see website www.[employer].it) offering EMDR to clients who may benefit. ? ?Phone [phone] to make an appointment ? ?Most insurance and HMO?s accepted: For more information about insurance and fees, contact Cephas Darby, [employer], at [phone]. She will gladly advise you of insurance benefits. ? ?Posted in [place] ?OFFICE ?Burnard Leigh ?[address] ?[place], [place] [address] ?Phone: [phone] ?Fax: [phone] ? ?

## 2021-09-19 NOTE — ED Triage Notes (Addendum)
Pt reports she quit her 20 years job and is not able to sleep, on and off having chest pain and abdominal pain x 3 months. States she lost her twin brother 9 years ago and that affects her too. Pt reports she is very sensitive to medications. Pt wants to know if she can have a prescription to make her sleep better.   ?

## 2021-09-19 NOTE — ED Provider Notes (Signed)
?[facility] ? ? ?MRN: [id number] DOB: 1981-03-01 ? ?Subjective:  ? ?Kathy Graham is a 41 y.o. female presenting for help with her sleep.  Patient has had significant difficulty getting to sleep, staying asleep.  She has follow-up agitated and anxious and panicky.  She has been working very hard to get her mental health situated since January.  She believes that she is trying to cope finally with having lost her twin brother 9 years ago.  States that for a while she felt like she might of been numb and for what ever reason is now affecting her dramatically.  Her anxiety did get bad enough to leave her longstanding job that she had for many years.  She has tried multiple medications to help her with her sleep but has not been successful at finding a reliable medication.  She does have clonazepam but has been really hesitant to use it as she is sensitive to medications.  She was on Wellbutrin but only took 2 doses as it made her anxiety worse.  She has previously tried hydroxyzine, Benadryl, Flexeril, melatonin and all have failed.  She is not keen on taking Ambien and would prefer to avoid this.  She does not have a psychiatrist but she is trying to work with a therapist right now has only had 1 visit with them and is supposed to see them again.  She does have a PCP and is trying to get back in with them.  She has previously had work-up that was done in 2021.  All of it ended up being negative in terms of her blood work.  She is trying to get another physical to make sure she gets screening labs again.  No thoughts of self-harm, suicidal ideation, homicidal ideation. ? ?No current facility-administered medications for this encounter. ? ?Current Outpatient Medications:  ?  buPROPion (WELLBUTRIN XL) 150 MG 24 hr tablet, Take 1 tablet (150 mg total) by mouth daily., Disp: 90 tablet, Rfl: 0 ?  busPIRone (BUSPAR) 7.5 MG tablet, Take 7.5 mg by mouth 2 (two) times daily., Disp: , Rfl:  ?   drospirenone-ethinyl estradiol (YAZ,GIANVI,LORYNA) 3-0.02 MG tablet, Take 1 tablet by mouth daily., Disp: , Rfl:  ?  fexofenadine (ALLEGRA) 180 MG tablet, Take 180 mg by mouth daily., Disp: , Rfl:  ?  Multiple Vitamin (MULTIVITAMIN WITH MINERALS) TABS tablet, Take 1 tablet by mouth daily., Disp: , Rfl:  ?  Oxymetazoline HCl (NASAL DECONGESTANT SPRAY NA), Place 2 puffs into the nose daily. , Disp: , Rfl:  ?  pantoprazole (PROTONIX) 40 MG tablet, TAKE 1 TABLET DAILY BEFORE BREAKFAST, Disp: 90 tablet, Rfl: 1 ?  triamcinolone (NASACORT ALLERGY 24HR) 55 MCG/ACT AERO nasal inhaler, Place 2 sprays into the nose daily., Disp: 1 each, Rfl: 3  ? ?Allergies  ?Allergen Reactions  ? Augmentin [Amoxicillin-Pot Clavulanate] Nausea And Vomiting  ? Cefzil [Cefprozil]   ?  Serum sickness  ? ? ?Past Medical History:  ?Diagnosis Date  ? Reflux   ?  ? ?Past Surgical History:  ?Procedure Laterality Date  ? BACK SURGERY    ? BIOPSY  12/16/2018  ? Procedure: BIOPSY;  Surgeon: Danie Binder, MD;  [facility];  Service: Endoscopy;;  esophagus ?gastric ?  ? COLONOSCOPY N/A 10/26/2019  ? Procedure: COLONOSCOPY;  Surgeon: Danie Binder, MD;  [facility];  Service: Endoscopy;  Laterality: N/A;  930am  ? ESOPHAGOGASTRODUODENOSCOPY N/A 12/16/2018  ? Dr. Oneida Alar: Patchy mild inflammation characterized by  congestion and erythema in the gastric body and gastric antrum.  Web found in the distal esophagus status post dilation.  Gastric biopsies showed gastritis but no H. pylori.  Esophageal biopsy showed reflux.  ? POLYPECTOMY  10/26/2019  ? Procedure: POLYPECTOMY;  Surgeon: Danie Binder, MD;  [facility];  Service: Endoscopy;;  descending  ? ? ?Family History  ?Problem Relation Age of Onset  ? Hyperlipidemia Mother   ? Colon polyps Mother   ?     before age 77  ? Hypothyroidism Mother   ? Hypertension Father   ? Colon polyps Father   ?     before age 32  ? Cancer Brother 37  ?     melanoma  ? Colon cancer Other    ? ? ?Social History  ? ?Tobacco Use  ? Smoking status: Never  ? Smokeless tobacco: Never  ?Substance Use Topics  ? Alcohol use: Yes  ?  Comment: occ glass of wine  ? Drug use: No  ? ? ?ROS ? ? ?Objective:  ? ?Vitals: ?BP 118/78 (BP Location: Right Arm)   Pulse 72   Temp 98.2 ?F (36.8 ?C) (Oral)   Resp 18   SpO2 100%  ? ?Physical Exam ?Constitutional:   ?   General: She is not in acute distress. ?   Appearance: Normal appearance. She is well-developed. She is not ill-appearing, toxic-appearing or diaphoretic.  ?HENT:  ?   Head: Normocephalic and atraumatic.  ?   Nose: Nose normal.  ?   Mouth/Throat:  ?   Mouth: Mucous membranes are moist.  ?Eyes:  ?   General: No scleral icterus.    ?   Right eye: No discharge.     ?   Left eye: No discharge.  ?   Extraocular Movements: Extraocular movements intact.  ?Cardiovascular:  ?   Rate and Rhythm: Normal rate.  ?Pulmonary:  ?   Effort: Pulmonary effort is normal.  ?Skin: ?   General: Skin is warm and dry.  ?Neurological:  ?   General: No focal deficit present.  ?   Mental Status: She is alert and oriented to person, place, and time.  ?Psychiatric:     ?   Mood and Affect: Mood is anxious. Mood is not depressed or elated. Affect is tearful. Affect is not labile, blunt, flat, angry or inappropriate.     ?   Speech: She is communicative. Speech is rapid and pressured. Speech is not delayed, slurred or tangential.     ?   Behavior: Behavior is not agitated, slowed, aggressive, withdrawn, hyperactive or combative.     ?   Thought Content: Thought content is not paranoid or delusional. Thought content does not include homicidal or suicidal ideation.     ?   Cognition and Memory: Cognition is not impaired. Memory is not impaired. She does not exhibit impaired recent memory or impaired remote memory.     ?   Judgment: Judgment is not impulsive or inappropriate.  ? ? ?Assessment and Plan :  ? ?PDMP not reviewed this encounter. ? ?1. Anxiety   ?2. Difficulty sleeping   ?3.  Insomnia, unspecified type   ?4. Panic attacks   ? ?I cannot appreciate that patient is having a mental health emergency and is not a threat to herself or others.  She is indeed making a sincere effort to get mental health help.  I recommend that she maintain buspirone for now.  She has an  upcoming appointment with a new PCP and I emphasized discussing what medication options were with them including a repeat panel of blood test.  I also suggested that she attempt to obtain a referral to psychiatrist or behavioral health specialist that can prescribe her mental health medications.  She does have a supply of clonazepam at night encouraged her to consider using this as she has tolerated it well in the past and has helped.  I reassured her that it would be safe to take with BuSpar.  I did offer her Flexeril she wanted a different option.  She will try this and if she does not have success with that, will use clonazepam tomorrow.  I did provide her with information to a different therapist to continue to try and get help with her anxiety, possible PTSD. Counseled patient on potential for adverse effects with medications prescribed/recommended today, ER and return-to-clinic precautions discussed, patient verbalized understanding. ? ?  ?Jaynee Eagles, PA-C ?09/19/21 1819 ? ?

## 2021-09-22 ENCOUNTER — Telehealth: Payer: Self-pay | Admitting: Family Medicine

## 2021-09-22 ENCOUNTER — Ambulatory Visit: Payer: BC Managed Care – PPO | Admitting: Family Medicine

## 2021-09-22 VITALS — BP 107/76 | HR 81 | Temp 98.1°F | Ht 63.0 in | Wt 123.0 lb

## 2021-09-22 DIAGNOSIS — F4323 Adjustment disorder with mixed anxiety and depressed mood: Secondary | ICD-10-CM | POA: Diagnosis not present

## 2021-09-22 MED ORDER — TRAZODONE HCL 50 MG PO TABS: 50.0000 mg | ORAL_TABLET | Freq: Every day | ORAL | 0 refills | Status: DC

## 2021-09-22 MED ORDER — ZOLPIDEM TARTRATE 5 MG PO TABS: 5.0000 mg | ORAL_TABLET | Freq: Every evening | ORAL | 1 refills | Status: DC | PRN

## 2021-09-22 NOTE — Telephone Encounter (Signed)
Patient states the trazodone doesn't not help her sleep can you prescribe something else. [place] ?

## 2021-09-22 NOTE — Patient Instructions (Signed)
Medication as prescribed. ? ?I will place referral and will also look into getting this test for you. ? ?Take care ? ?Dr. Lacinda Axon  ?

## 2021-09-23 ENCOUNTER — Ambulatory Visit (INDEPENDENT_AMBULATORY_CARE_PROVIDER_SITE_OTHER): Payer: BC Managed Care – PPO | Admitting: Licensed Clinical Social Worker

## 2021-09-23 ENCOUNTER — Encounter: Payer: Self-pay | Admitting: Family Medicine

## 2021-09-23 DIAGNOSIS — F322 Major depressive disorder, single episode, severe without psychotic features: Secondary | ICD-10-CM

## 2021-09-23 NOTE — Progress Notes (Signed)
Comprehensive Clinical Assessment (CCA) Note ? ?09/23/2021 ?Kathy Graham ?[id number] ? ?Chief Complaint:  ?Chief Complaint  ?Patient presents with  ? Depression  ? ?Visit Diagnosis: Major Depression, Recurrent, Severe without psychosis  ? ?Kathy Graham presents today accompanied by her parents who sit in on this assessment. Kathy Graham was reportedly always a happy person until around 10/03/12 when her twin brother with whom she was close was diagnosed with cancer and died within a short period of time.  ? ?She built a house in 2015-10-04 so she could have a dog. She got a dog in Oct 04, 2019; however, around this time her mother had surgery for a "spot of cancer" on her appendix. Kathy Graham got overwhelmed with caring for the dog and worrying about her house and ended up seeking behavioral health treatment for the first time in her life. She says that after her brother's death that she had trouble eating and sleeping but did not seek treatment. ? ?She has been tried on Wellbutrin, Lexapro, Zoloft, and Effexor but could not tolerate them. One of the medications made her sick on her stomach and the SSRIs tend to make her feel worse. She has had no issues with Trazodone other than the 50 mg she was taking gave her a dry mouth such that she would have to wake up to drink water. Her PCP recently put her on Ambien because of this. She continues to take Buspar, ? ?Her GAD-7 yesterday was a 21 and PHQ-9 was a 23. It is suggested that she may possibly be able to take a tricyclic anti-depressant for sleep and depression but would likely need to start on a very small dose and be increased very slowly due to her medication sensitivity or could possibly ask about Remeron.  ? ?She notes that her twin brother has issues with depression but eventually concluded that it was "ADHD." Her mom says that a maternal niece used to wash her hands so much that she had to wear gloves due to the irritation suspecting that she had OCD. ? ?CCA Screening, Triage and  Referral (STR) ? ?Patient Reported Information ?How did you hear about [place]? Other (Comment) ? ?Referral name: Dr. Thersa Salt ? ?Referral phone number: [phone] ? ? ?Whom do you see for routine medical problems? Primary Care ? ?Practice/Facility Name: No data recorded ?Practice/Facility Phone Number: No data recorded ?Name of Contact: No data recorded ?Contact Number: No data recorded ?Contact Fax Number: No data recorded ?Prescriber Name: No data recorded ?Prescriber Address (if known): No data recorded ? ?What Is the Reason for Your Visit/Call Today? twin brother died in 2012/10/03 ? ?How Long Has This Been Causing You Problems? 1-6 months ? ?What Do You Feel Would Help You the Most Today? Treatment for Depression or other mood problem ? ? ?Have You Recently Been in Any Inpatient Treatment (Hospital/Detox/Crisis Center/28-Day Program)? No ? ?Name/Location of Program/Hospital:No data recorded ?How Long Were You There? No data recorded ?When Were You Discharged? No data recorded ? ?Have You Ever Received Services From [employer] Before? Yes ? ?Who Do You See at [facility]? No data recorded ? ?Have You Recently Had Any Thoughts About Hurting Yourself? No ? ?Are You Planning to Commit Suicide/Harm Yourself At This time? No ? ? ?Have you Recently Had Thoughts About [place]? No ? ?Explanation: No data recorded ? ?Have You Used Any Alcohol or Drugs in the Past 24 Hours? No ? ?How Long Ago Did You Use Drugs or Alcohol? No data recorded ?What  Did You Use and How Much? No data recorded ? ?Do You Currently Have a Therapist/Psychiatrist? No ? ?Name of Therapist/Psychiatrist: No data recorded ? ?Have You Been Recently Discharged From Any Office Practice or Programs? No ? ?Explanation of Discharge From Practice/Program: No data recorded ? ?  ?CCA Screening Triage Referral Assessment ?Type of Contact: Face-to-Face ? ?Is this Initial or Reassessment? No data recorded ?Date Telepsych consult ordered in [facility]:  No data  recorded ?Time Telepsych consult ordered in [facility]:  No data recorded ? ?Patient Reported Information Reviewed? No data recorded ?Patient Left Without Being Seen? No data recorded ?Reason for Not Completing Assessment: No data recorded ? ?Collateral Involvement: parents ? ? ?Does Patient Have a Stage manager Guardian? No data recorded ?Name and Contact of Legal Guardian: No data recorded ?If Minor and Not Living with Parent(s), Who has Custody? No data recorded ?Is CPS involved or ever been involved? Never ? ?Is APS involved or ever been involved? Never ? ? ?Patient Determined To Be At Risk for Harm To Self or Others Based on Review of Patient Reported Information or Presenting Complaint? No ? ?Method: No data recorded ?Availability of Means: No data recorded ?Intent: No data recorded ?Notification Required: No data recorded ?Additional Information for Danger to Others Potential: No data recorded ?Additional Comments for Danger to Others Potential: No data recorded ?Are There Guns or Other Weapons in [place]? No data recorded ?Types of Guns/Weapons: No data recorded ?Are These Weapons Safely Secured?                            No data recorded ?Who Could Verify You Are Able To Have These Secured: No data recorded ?Do You Have any Outstanding Charges, Pending Court Dates, Parole/Probation? No data recorded ?Contacted To Inform of Risk of Harm To Self or Others: No data recorded ? ?Location of Assessment: Other (comment) ([place]) ? ? ?Does Patient Present under Involuntary Commitment? No ? ?IVC Papers Initial File Date: No data recorded ? ?[place] of Residence: [place] ? ? ?Patient Currently Receiving the Following Services: No data recorded ? ?Determination of Need: Routine (7 days) ? ? ?Options For Referral: Outpatient Therapy; Intensive Outpatient Therapy ? ? ? ? 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 ? ?Current Symptoms/Problems: No data recorded ? ?Patient Reported Schizophrenia/Schizoaffective Diagnosis in Past: No ? ? ?Strengths: "loyal;" was "adaptable," inquisitive, loves to learn,  and conscientious ? ?Preferences: No data recorded ?Abilities: No data recorded ? ?Type of Services Patient Feels are Needed: No data recorded ? ?Initial Clinical Notes/Concerns: No data recorded ? ?Mental Health Symptoms ?Depression:   ?Difficulty Concentrating; Sleep (too much or little); Tearfulness; Increase/decrease in appetite; Fatigue; Change in energy/activity; Weight gain/loss; Hopelessness; Worthlessness; Irritability (mostly only getting 3-4 hours and then getting 30 minutes hear and there waking up due to nightmares; at first she was not eating all all but it has "gotten better") ?  ?Duration of Depressive symptoms:  ?Greater than two weeks (feels now like she is a failure; she had lost 10 lbs. but gained 5 lbs. back) ?  ?Mania:   ?N/A ?  ?Anxiety:    ?Worrying; Tension ?  ?Psychosis:   ?None ?  ?Duration of Psychotic symptoms: No data recorded  ?Trauma:  No data recorded  ?  Obsessions:   ?None ?  ?Compulsions:   ?None ?  ?Inattention:   ?None ?  ?Hyperactivity/Impulsivity:   ?None ?  ?Oppositional/Defiant Behaviors:   ?None ?  ?Emotional Irregularity:   ?None ?  ?Other Mood/Personality Symptoms:  No data recorded  ? ?Mental Status Exam ?Appearance and self-care  ?Stature:   ?Small ?  ?Weight:   ?Average weight ?  ?Clothing:   ?Casual ?  ?Grooming:   ?Well-groomed ?  ?Cosmetic use:   ?None ?  ?Posture/gait:   ?Normal ?  ?Motor activity:   ?Not Remarkable ?  ?Sensorium  ?Attention:   ?Normal ?  ?Concentration:   ?Anxiety interferes ?  ?Orientation:   ?X5 ?  ?Recall/memory:   ?Normal ?  ?Affect and Mood  ?Affect:   ?Anxious; Depressed ?  ?Mood:   ?Depressed; Anxious ?  ?Relating  ?Eye contact:   ?None ?  ?Facial expression:  No data recorded   ?Attitude toward examiner:   ?Cooperative ?  ?Thought and Language  ?Speech flow:  ?Pressured ?  ?Thought content:   ?-- (perseverates on life decisions she has made regarding her job, getting a dog, her house, e

## 2021-09-23 NOTE — Progress Notes (Signed)
? ?Subjective:  ?Patient ID: Kathy Graham, female    DOB: [birth year]  Age: 41 y.o. MRN: [id number] ? ?CC: ?Chief Complaint  ?Patient presents with  ? Anxiety  ?  Depression   ? ? ?HPI: ? ?41 year old female presents for follow-up. ? ?Since her last visit, patient has stopped taking the Wellbutrin as she did not like how it made her feel.  She is taking the BuSpar.  It helps but she is still having considerable difficulty.  Patient has quit her job and has moved back here.  She is now second-guessing her move back as she does not have a job currently.  Patient is not sleeping well.  She is feeling very anxious.  GAD-7 score of 21.  Patient's PHQ-9 score is 23.  Patient is interested in seeing a psychiatrist.  She is also interested in [employer].  No suicidal ideation.  Patient is very concerned about her mental health. ? ?Patient Active Problem List  ? Diagnosis Date Noted  ? Adjustment reaction with anxiety and depression 08/12/2021  ? Colon cancer screening   ? GERD (gastroesophageal reflux disease) 04/28/2019  ? FH: colon polyps 04/28/2019  ? Esophageal dysphagia   ? ? ?Social Hx   ?Social History  ? ?Socioeconomic History  ? Marital status: Unknown  ?  Spouse name: Not on file  ? Number of children: Not on file  ? Years of education: Not on file  ? Highest education level: Not on file  ?Occupational History  ? Not on file  ?Tobacco Use  ? Smoking status: Never  ? Smokeless tobacco: Never  ?Vaping Use  ? Vaping Use: Not on file  ?Substance and Sexual Activity  ? Alcohol use: Yes  ?  Comment: occ glass of wine  ? Drug use: No  ? Sexual activity: Not Currently  ?  Birth control/protection: Pill  ?Other Topics Concern  ? Not on file  ?Social History Narrative  ? Not on file  ? ?Social Determinants of Health  ? ?Financial Resource Strain: Not on file  ?Food Insecurity: Not on file  ?Transportation Needs: Not on file  ?Physical Activity: Not on file  ?Stress: Not on file  ?Social Connections: Not on file   ? ? ?Review of Systems ?Per HPI ? ?Objective:  ?BP 107/76   Pulse 81   Temp 98.1 ?F (36.7 ?C)   Ht '5\' 3"'$  (1.6 m)   Wt 123 lb (55.8 kg)   SpO2 100%   BMI 21.79 kg/m?  ? ? ?  09/22/2021  ?  2:25 PM 09/19/2021  ?  5:36 PM 08/12/2021  ?  2:35 PM  ?BP/Weight  ?Systolic BP [phone]  ?Diastolic BP 76 78 72  ?Wt. (Lbs) 123  122  ?BMI 21.79 kg/m2  21.61 kg/m2  ? ? ?Physical Exam ?Vitals and nursing note reviewed.  ?Constitutional:   ?   General: She is not in acute distress. ?   Appearance: Normal appearance.  ?Cardiovascular:  ?   Rate and Rhythm: Normal rate and regular rhythm.  ?   Heart sounds: No murmur heard. ?Pulmonary:  ?   Effort: Pulmonary effort is normal.  ?   Breath sounds: Normal breath sounds. No wheezing or rales.  ?Neurological:  ?   Mental Status: She is alert.  ?Psychiatric:  ?   Comments: Very anxious.  ? ? ?Lab Results  ?Component Value Date  ? WBC 7.4 09/05/2019  ? HGB 12.5 09/05/2019  ? HCT 38.3 09/05/2019  ?  PLT 286 09/05/2019  ? GLUCOSE 81 09/05/2019  ? CHOL 194 09/05/2019  ? TRIG 149 09/05/2019  ? HDL 72 09/05/2019  ? [place] 97 09/05/2019  ? ALT 15 09/05/2019  ? AST 15 09/05/2019  ? NA 139 09/05/2019  ? K 4.6 09/05/2019  ? CL 103 09/05/2019  ? CREATININE 0.70 09/05/2019  ? BUN 10 09/05/2019  ? CO2 23 09/05/2019  ? TSH 3.200 09/05/2019  ? ? ? ?Assessment & Plan:  ? ?Problem List Items Addressed This Visit   ? ?  ? Other  ? Adjustment reaction with anxiety and depression - Primary  ?  Uncontrolled, worsening. Continue BuSpar.  Referring to psychiatry.  Initially prescribed trazodone but patient messaged and states that she cannot take this medication.  Ambien for sleep.  We will try and arrange for patient to have Genesightn test. ?  ?  ? Relevant Orders  ? Ambulatory referral to Psychiatry  ? ? ?Meds ordered this encounter  ?Medications  ? traZODone (DESYREL) 50 MG tablet  ?  Sig: Take 1 tablet (50 mg total) by mouth at bedtime.  ?  Dispense:  90 tablet  ?  Refill:  0  ? zolpidem (AMBIEN) 5 MG  tablet  ?  Sig: Take 1 tablet (5 mg total) by mouth at bedtime as needed for sleep.  ?  Dispense:  15 tablet  ?  Refill:  1  ? ?Thersa Salt DO ?[place] ? ?

## 2021-09-23 NOTE — Assessment & Plan Note (Addendum)
Uncontrolled, worsening. Continue BuSpar.  Referring to psychiatry.  Initially prescribed trazodone but patient messaged and states that she cannot take this medication.  Ambien for sleep.  We will try and arrange for patient to have Genesightn test. ?

## 2021-09-23 NOTE — Plan of Care (Signed)
Kathy Graham agrees to the Care Plan:  ?Problem:  Depression  ?Goal:  Kathy Graham will experience a decrease in her depressive symptoms as evidenced by her PHQ-9 score dropping from a 23 to a 4 or less as of 03/25/2022.  ?Outcome: Not Applicable ?  ?

## 2021-09-24 ENCOUNTER — Ambulatory Visit: Payer: BC Managed Care – PPO | Admitting: Family Medicine

## 2021-09-25 ENCOUNTER — Telehealth (HOSPITAL_COMMUNITY): Payer: Self-pay | Admitting: Licensed Clinical Social Worker

## 2021-09-25 NOTE — Telephone Encounter (Signed)
The therapist receives the following email from [employer] today at 9:58 a.m. EST: ? ?"Gurney Maxin [email or web address]> ?[email or web address], Gwyndolyn Saxon ?Thu 09/25/2021 9:58 AM ?*Caution - External email - see footer for warnings* ? ?Hi Bill, ? ?It was nice to speak with you this week.  Question---the psychiatrist appointment is not until 7/5?how can we get the genesite test done earlier?  Also, should work my family doctor to prescribe the medicines you recommended during this time?" ? ?The therapist calls Kathy Graham verifying her identify via three identifiers. The therapist asks if her PCP could order the GeneSight testing; however, she has already confirmed that he cannot. ? ?As her in-person appointment at this office is not until 07/05, the therapist reminds her of the tele-psychiatry options available to her via calling the toll free number on the back of her insurance card. The therapist notes that he does not know if a tele-psychiatrist could send her somewhere for the GeneSight testing and order it; however, the therapist notes that her history, thus far, seems to suggest that she is likely able to tolerate a tricyclic anti-depressant vs.a SSRI.  ? ?She is encourages to call this therapist again on a p.r.n. basis. ? ?Adam Phenix, MA, LCSW, [facility], LCAS ?09/25/2021 ?

## 2021-09-25 NOTE — Telephone Encounter (Signed)
Note created in error.

## 2021-10-02 ENCOUNTER — Ambulatory Visit (INDEPENDENT_AMBULATORY_CARE_PROVIDER_SITE_OTHER): Payer: BC Managed Care – PPO | Admitting: Licensed Clinical Social Worker

## 2021-10-02 DIAGNOSIS — F322 Major depressive disorder, single episode, severe without psychotic features: Secondary | ICD-10-CM

## 2021-10-02 NOTE — Progress Notes (Signed)
THERAPIST PROGRESS NOTE ? ?Session Time: 10 a.m. to 11 a.m.  ? ?Type of Therapy: Individual ? ?Treatment Goals addressed: Kathy Graham will experience a decrease in her depressive symptoms as evidenced by her PHQ-9 score dropping from a 23 to a 4 or less as of 03/25/2022. ? ?Progress Towards Goals: Not Progressing ? ?Interventions: Cognitive-behavioral therapy ? ?Summary: Kathy Graham presents for her 1st therapist session since her CCA. She says that she had a tele-psychiatry appointment through a vendor through her insurance and that the session only lasted 14 minutes. She says that the provider did not really listen to her and told her to take what she is already on i.e. the Trazodone. She says that she tried Ambien on Monday and still had a night of light sleep. She took it Tuesday night and woke up at 4 a.m. Last night, she took Trazodone and got light sleep as well. When the therapist talks to her about when she wants to go to bed at night, she becomes tearful in talking about how she does not have a routine. She notes that mornings are the worse for her as she starts looping and perseverates on why she made the past decisions that she made and how things could have been different if she made other choices. The major focus of this session involves how Kathy Graham can use thought-stopping techniques to stop this looping and how she can utilize radical acceptance. She admits that she has not fully accepted her present situation and the fact that she continues to fight it is only making her anxiety and depression worse.  ? ?Suicidal/Homicidal: No suicidal or homicidal ideation reported ? ?Therapist Response: The therapist educates her about Trazodone being an anti-depressant and reiterates what the medication prescriber told her about needing to take it daily. He talks about the importance of Kathy Graham's maintaining a consistent schedule regarding when she goes to bed and when she wakes up. He introduces to some  thought-stopping techniques and the concept of how to practice radical acceptance and the potential benefits of doing so. He notes that Kathy Graham is compassionate to others while being overly hard on herself with which Kathy Graham agrees. Thus, as she cannot be objective when it comes to her own choices, the therapist encourages her to put a good friend into her own situations and ask what she would say to this friend and then say the same thing to herself via self-talk. As she has questions about getting the GenPsych testing done noting that she has been told that her PCP can order it; however, he does not believe that he can; the therapist provides her with a [employer] brochure with the Customer Service number for her to call to see if she can get this testing done prior to her scheduled psychiatric evaluation at this office.  ? ?Plan: Return again in 1 week. ? ?Diagnosis: Severe major depression without psychotic features ? ?Collaboration of Care: N/A ? ?Patient/Guardian was advised Release of Information must be obtained prior to any record release in order to collaborate their care with an outside provider. Patient/Guardian was advised if they have not already done so to contact the registration department to sign all necessary forms in order for [place] to release information regarding their care.  ? ?Consent: Patient/Guardian gives verbal consent for treatment and assignment of benefits for services provided during this visit. Patient/Guardian expressed understanding and agreed to proceed.  ? ?Adam Phenix, MA, LCSW, [facility], LCAS ?10/02/2021 ?

## 2021-10-07 IMAGING — US US BREAST*R* LIMITED INC AXILLA
1 series · 6 of 6 positions shown · non-contrast
Comparison: Previous exam(s).

CLINICAL DATA: Patient was called back from screening mammogram for
a possible mass in the right breast.

EXAM:
DIGITAL DIAGNOSTIC RIGHT MAMMOGRAM WITH TOMO
ULTRASOUND RIGHT BREAST

[Series 1: us breast*right* limited inc axilla · 0.06mm/px · 6 of 6 slices shown]
[im 1/6]
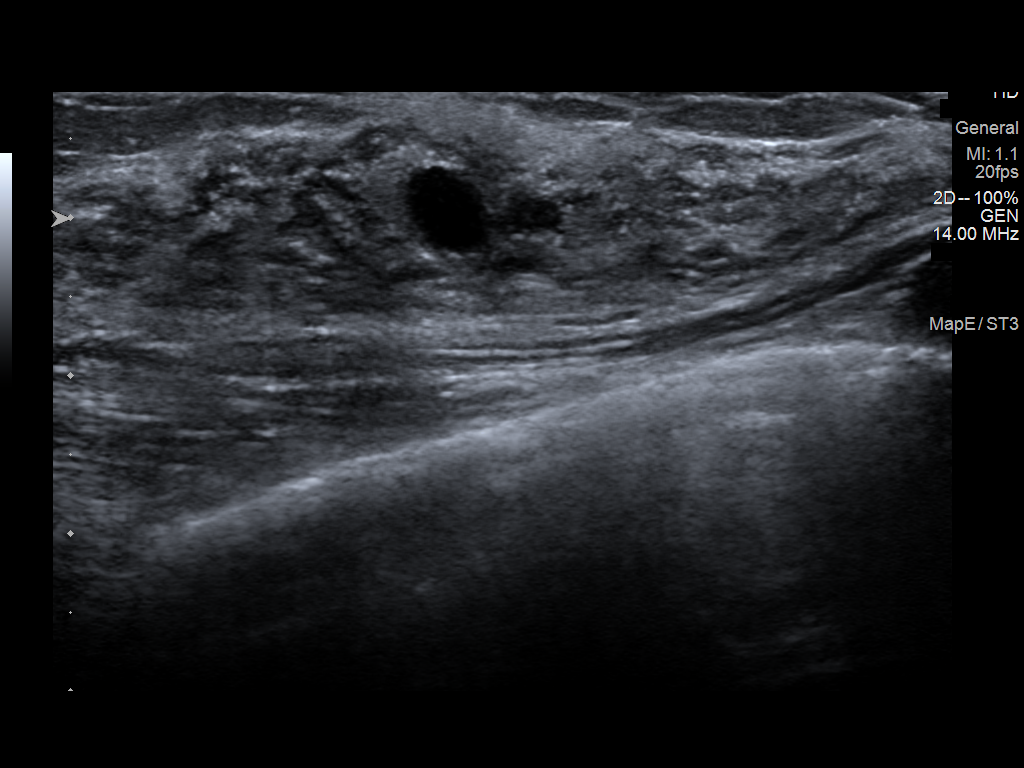
[im 2/6]
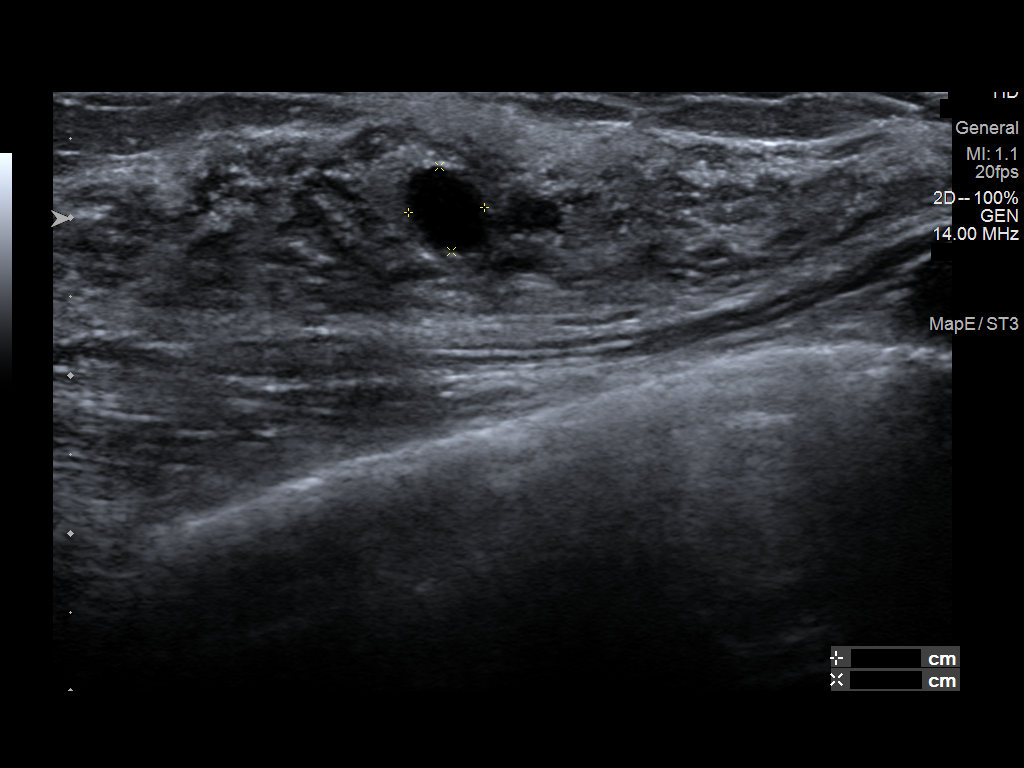
[im 3/6]
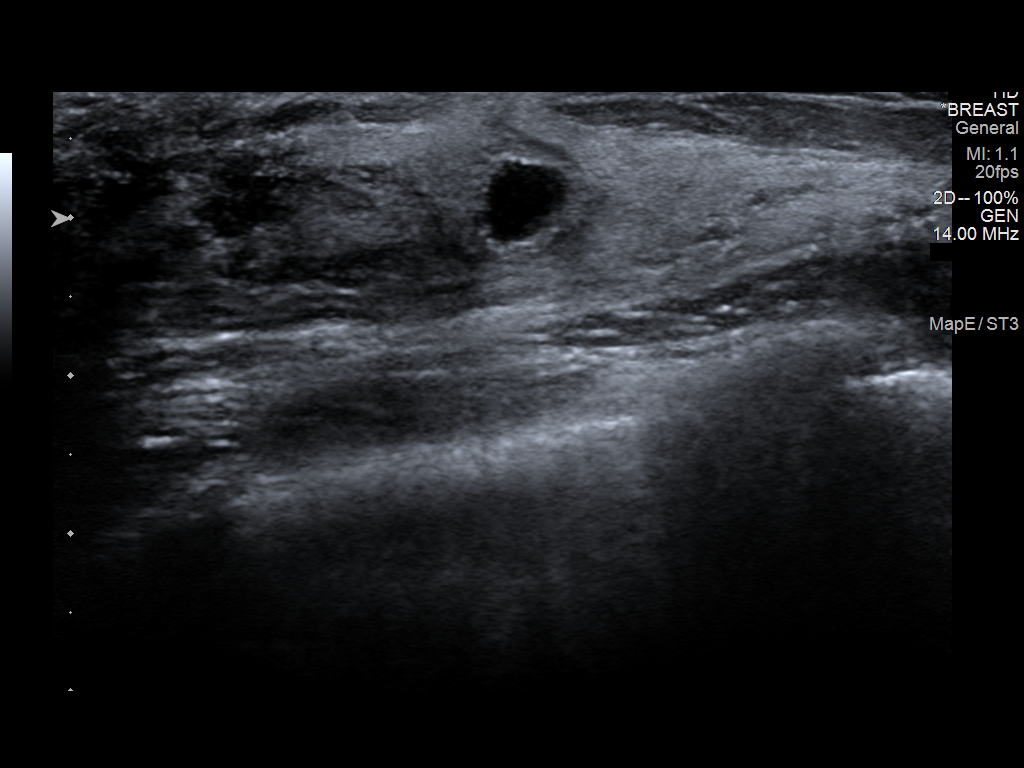
[im 4/6]
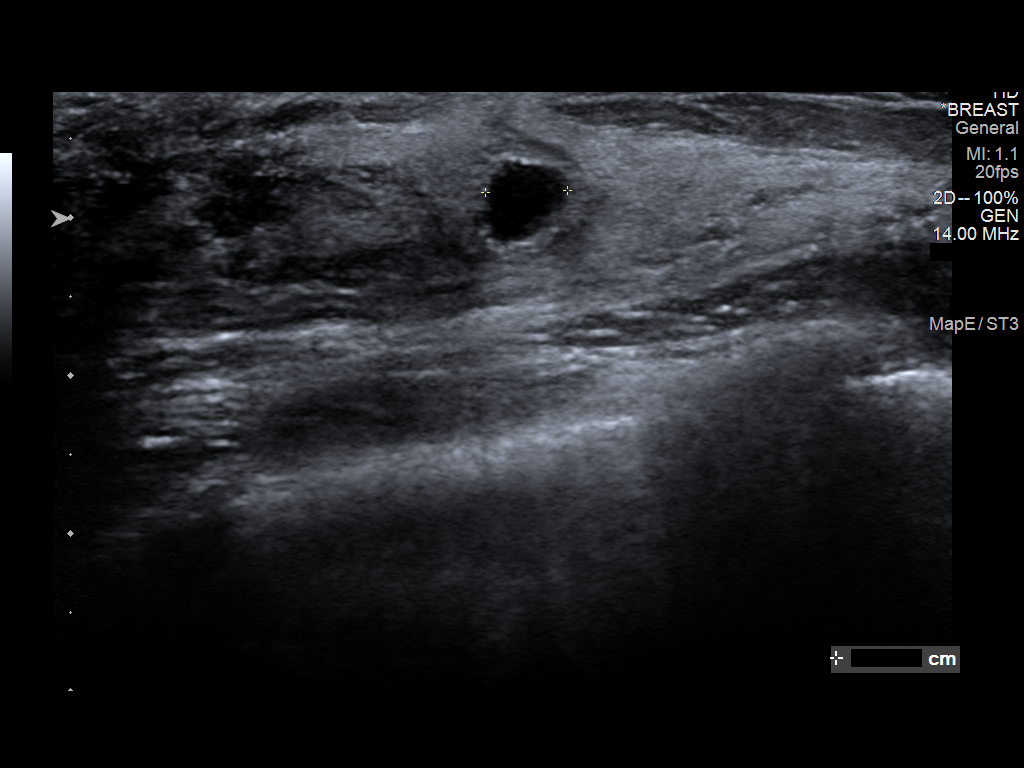
[im 5/6]
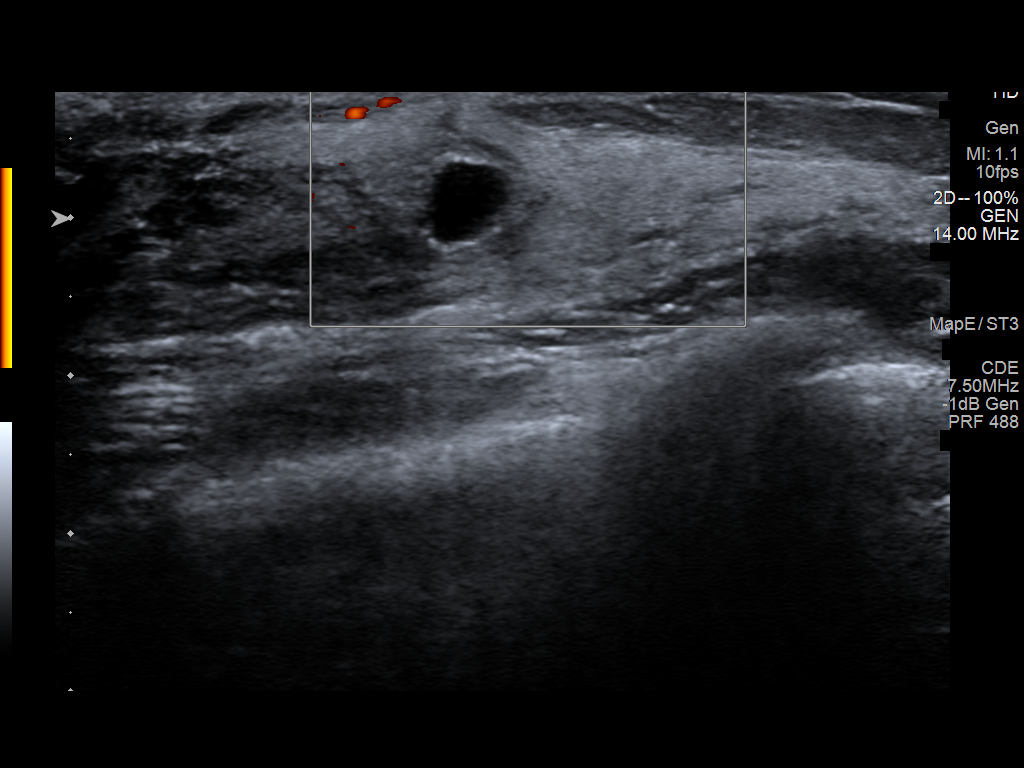
[im 6/6]
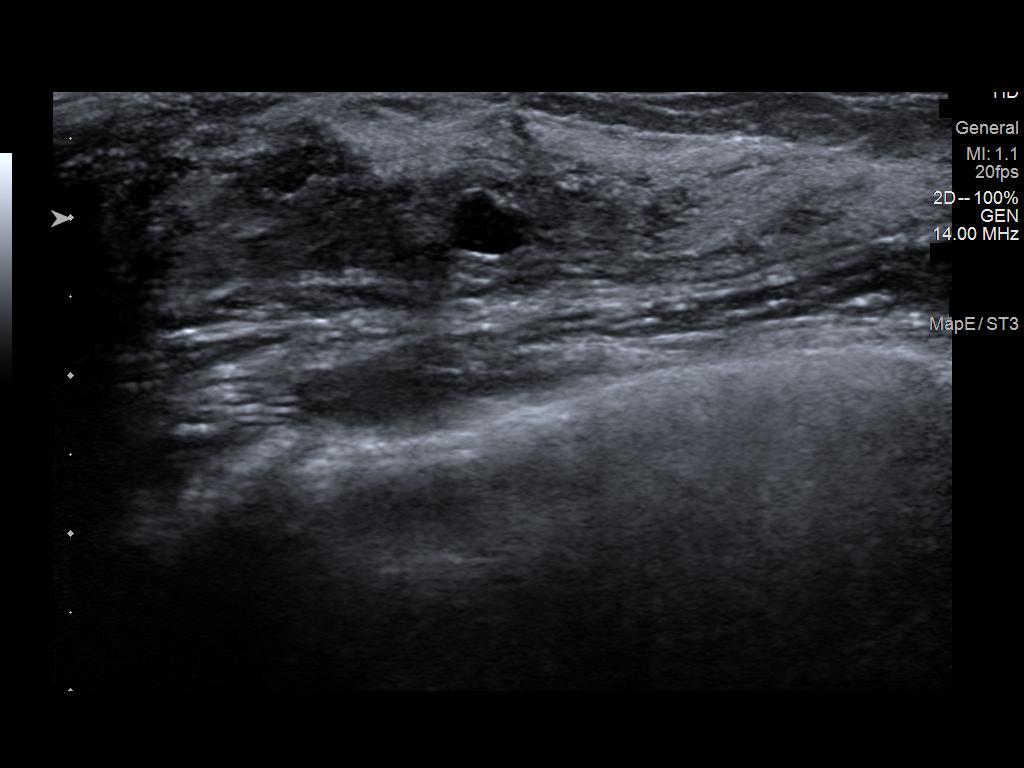

[6 of 6 positions shown; findings below may reference images not displayed]

ACR Breast Density Category d: The breast tissue is extremely dense,
which lowers the sensitivity of mammography.
FINDINGS: Additional imaging of the right breast was performed. The obscured
mass in the medial aspect of the right breast seen on the screening
mammogram is less apparent on the spot compression views.

Targeted ultrasound is performed, showing 2 well-circumscribed
anechoic cysts in the right breast at 2 o'clock 1 cm from the nipple
measuring 5 x 5 x 5 mm and 3 x 4 x 4 mm. No solid mass or abnormal
shadowing detected.
IMPRESSION: Right breast cysts.  No evidence of malignancy in the right breast.

RECOMMENDATION:
Bilateral screening mammogram in 1 year is recommended.

I have discussed the findings and recommendations with the patient.
If applicable, a reminder letter will be sent to the patient
regarding the next appointment.

BI-RADS CATEGORY  2: Benign.

## 2021-10-07 IMAGING — MG MM DIGITAL DIAGNOSTIC UNILAT*R* W/ TOMO
4 series · 4 of 12 positions shown · non-contrast
Comparison: Previous exam(s).

CLINICAL DATA: Patient was called back from screening mammogram for
a possible mass in the right breast.

EXAM:
DIGITAL DIAGNOSTIC RIGHT MAMMOGRAM WITH TOMO
ULTRASOUND RIGHT BREAST

[R CC synth-2D]
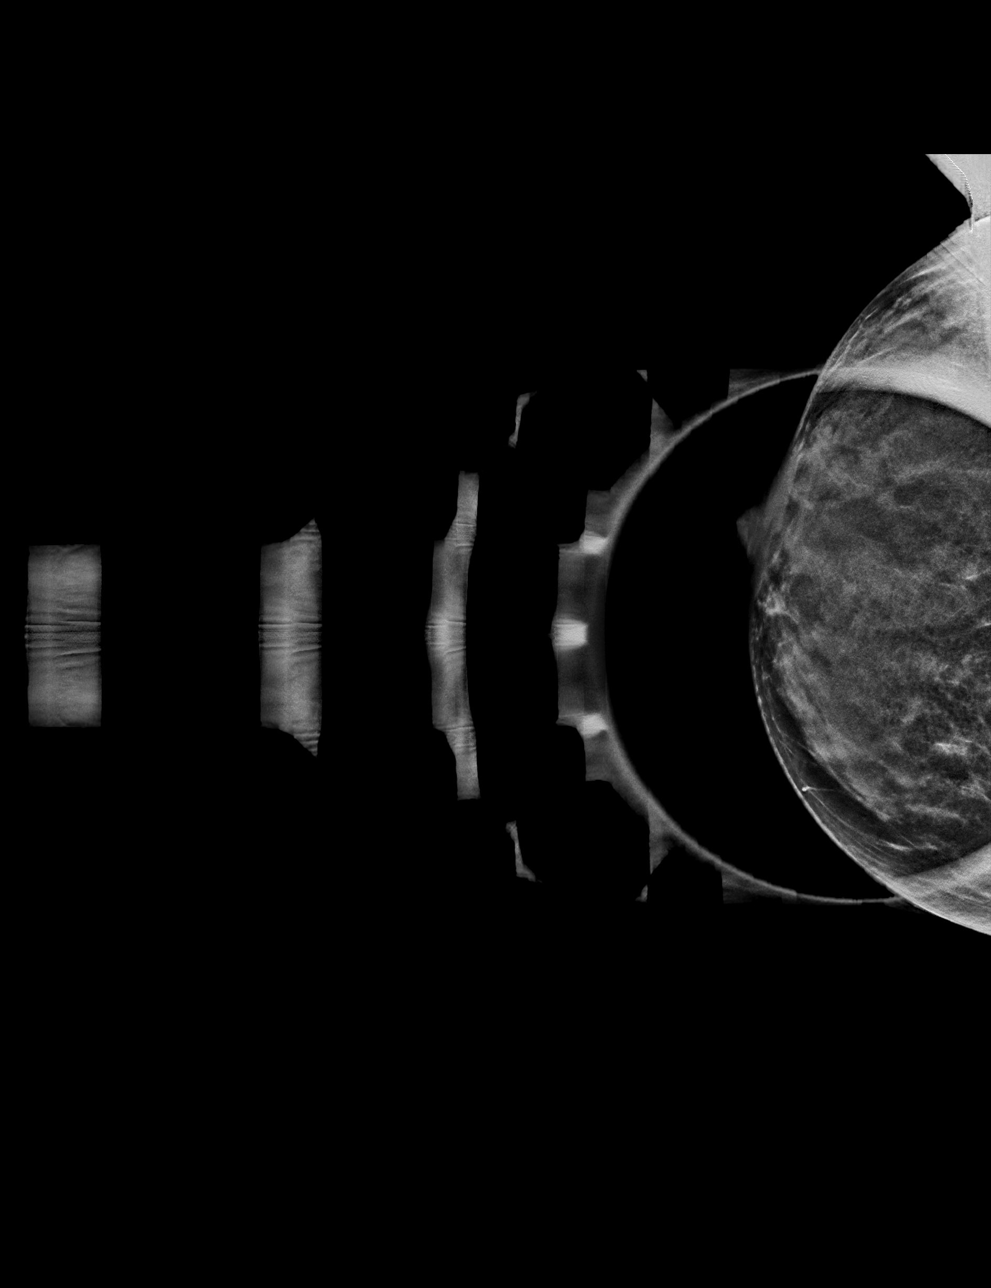

[R MLO synth-2D]
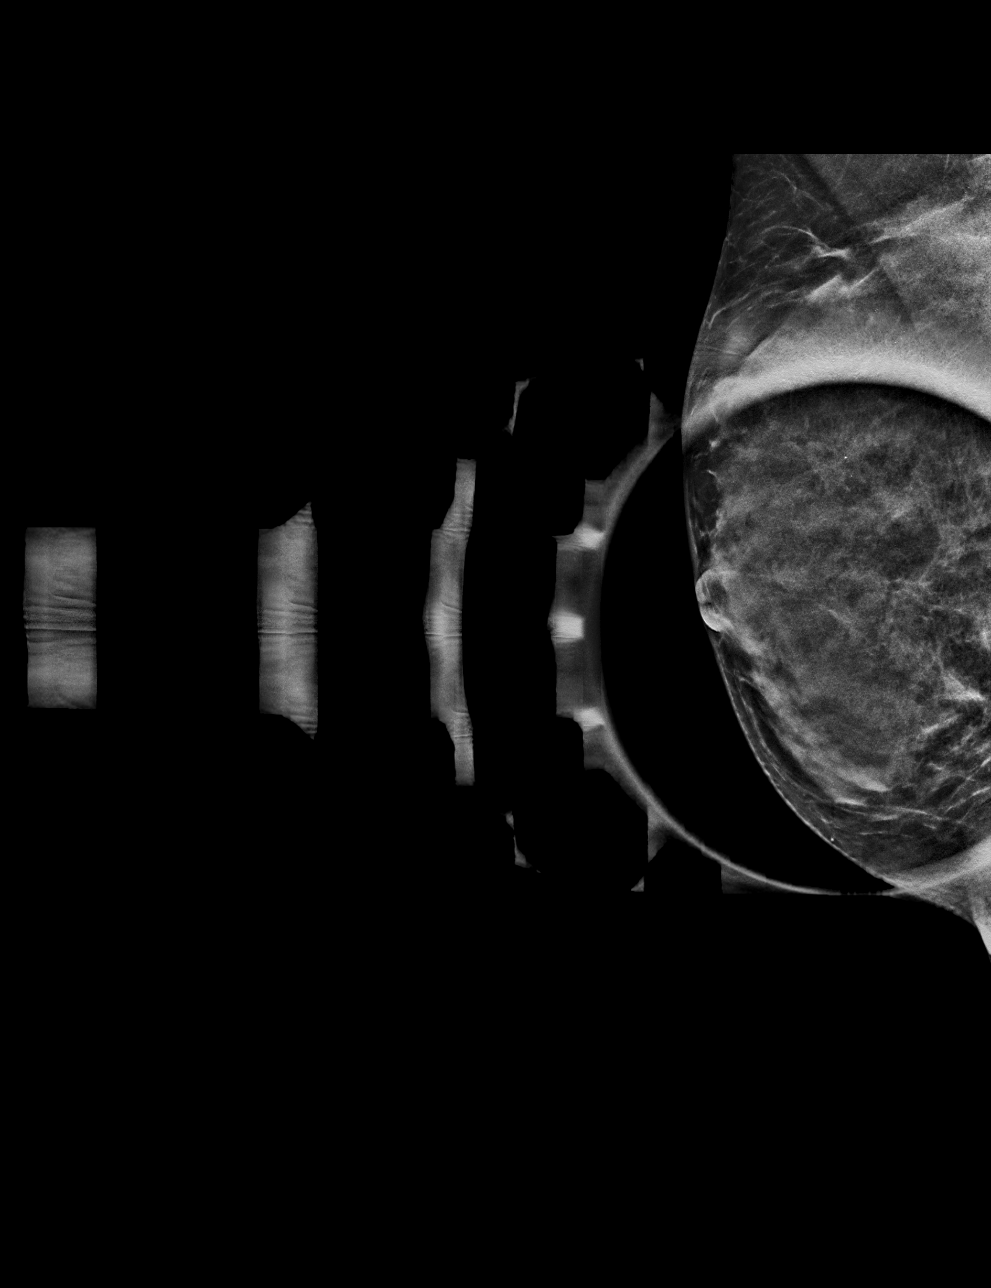

[R CC tomo · tomo slice 21/41.0]
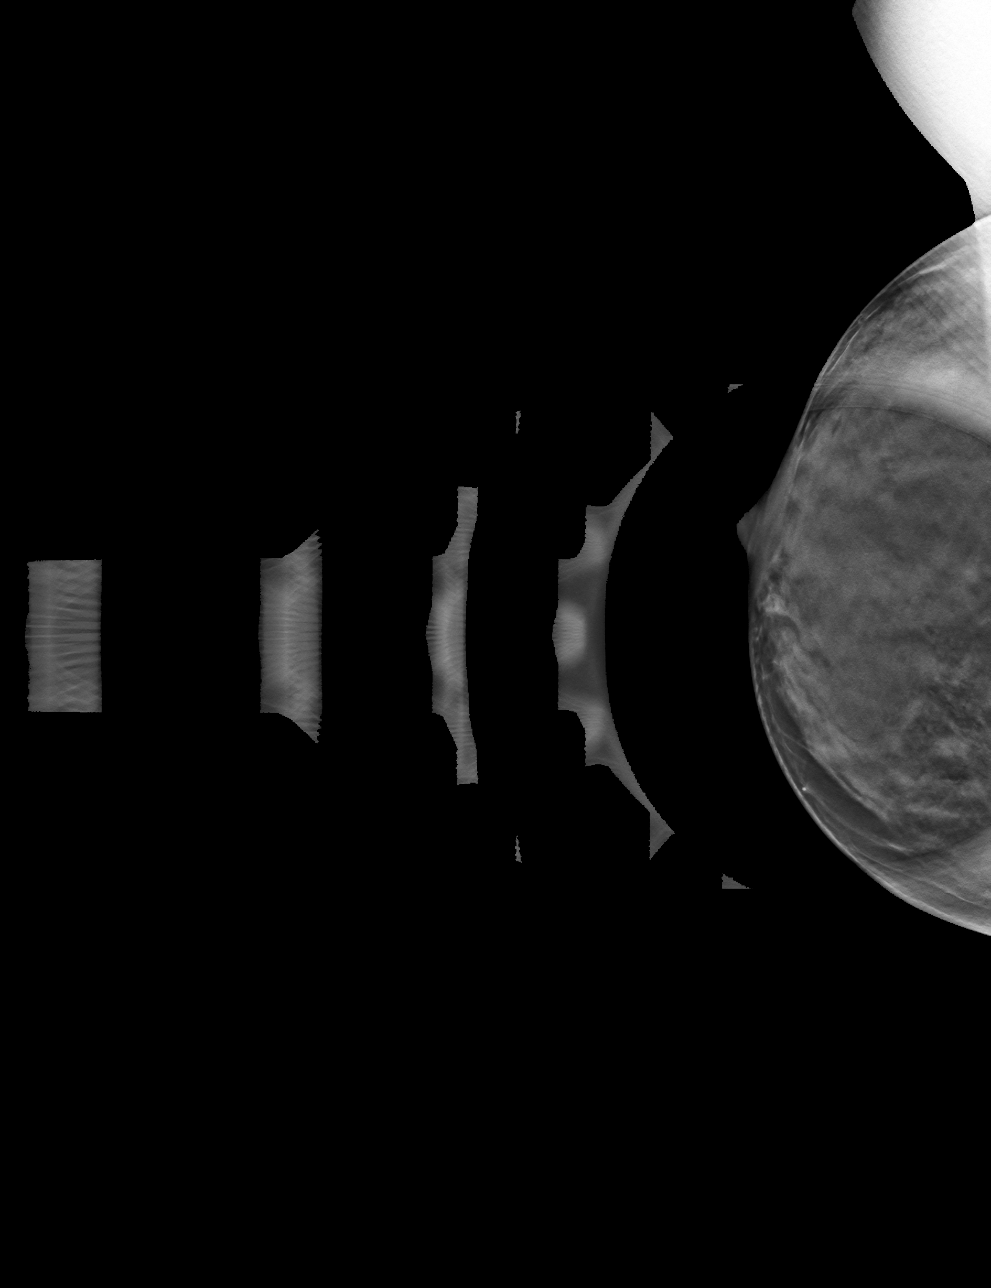

[R MLO tomo · tomo slice 21/42.0]
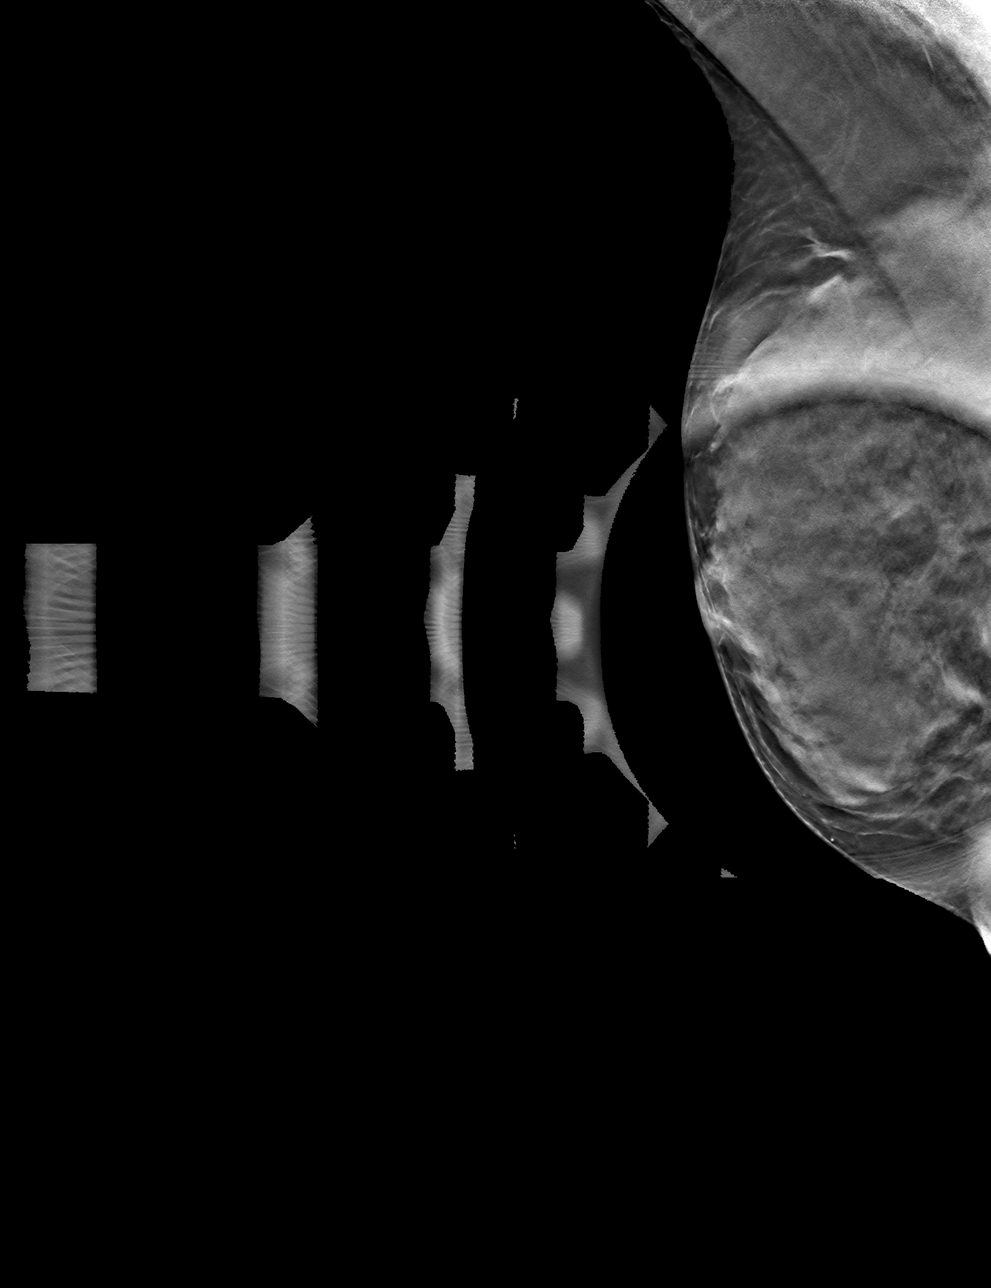

[4 of 12 positions shown; findings below may reference images not displayed]

ACR Breast Density Category d: The breast tissue is extremely dense,
which lowers the sensitivity of mammography.
FINDINGS: Additional imaging of the right breast was performed. The obscured
mass in the medial aspect of the right breast seen on the screening
mammogram is less apparent on the spot compression views.

Targeted ultrasound is performed, showing 2 well-circumscribed
anechoic cysts in the right breast at 2 o'clock 1 cm from the nipple
measuring 5 x 5 x 5 mm and 3 x 4 x 4 mm. No solid mass or abnormal
shadowing detected.
IMPRESSION: Right breast cysts.  No evidence of malignancy in the right breast.

RECOMMENDATION:
Bilateral screening mammogram in 1 year is recommended.

I have discussed the findings and recommendations with the patient.
If applicable, a reminder letter will be sent to the patient
regarding the next appointment.

BI-RADS CATEGORY  2: Benign.

## 2021-10-08 ENCOUNTER — Other Ambulatory Visit: Payer: Self-pay | Admitting: Family Medicine

## 2021-10-08 ENCOUNTER — Telehealth: Payer: Self-pay

## 2021-10-08 NOTE — Telephone Encounter (Signed)
Has an appt on 10/21/21 and is requesting to have lab work done prior to going out of town .  ?

## 2021-10-08 NOTE — Telephone Encounter (Signed)
Patient states its been awhile since she had regular labs checked and has been really tired lately.  ?

## 2021-10-08 NOTE — Telephone Encounter (Signed)
Caller name:Anzley Laurance Flatten  ? ?On DPR? :Yes ? ?Call back number:[phone] ? ?Provider they see: Lacinda Axon ? ?Reason for call: Pt is out of town next week and wants blood work order before she leaves she has up coming appt  ? ?

## 2021-10-09 ENCOUNTER — Ambulatory Visit (INDEPENDENT_AMBULATORY_CARE_PROVIDER_SITE_OTHER): Payer: BC Managed Care – PPO | Admitting: Licensed Clinical Social Worker

## 2021-10-09 DIAGNOSIS — F322 Major depressive disorder, single episode, severe without psychotic features: Secondary | ICD-10-CM | POA: Diagnosis not present

## 2021-10-09 NOTE — Progress Notes (Signed)
THERAPIST PROGRESS NOTE ?  ?Session Time: 11 a.m. to 12 p.m.  ?  ?Type of Therapy: Individual ?  ?Treatment Goals addressed: Kathy Graham will experience a decrease in her depressive symptoms as evidenced by her PHQ-9 score dropping from a 23 to a 4 or less as of 03/25/2022. ?  ?Progress Towards Goals: Progressing ?  ?Interventions/Therapist Response: Cognitive-behavioral therapy/The therapist reinforces her efforts to exercise and explains to her that the target dosage for her Trazodone to work for depression is at least 150 mg. The therapist assists Kathy Graham in recognizing all the reasons that she left her previous job noting that she apparently loved her co-workers but did not like the culture of the [place] that took over her company. The therapist suggests that just because Kathy Graham no longer works at her old job does not mean that she cannot still socialize with and maintain friendships with former co-workers whom she misses.  ?  ?Summary: Kathy Graham presents today saying that she has implemented some of the techniques discussed at her last session and is perhaps ?some better? regarding not ?looping? over the decisions that she made and in not berating herself. She has not investigated the [facility] testing yet. She says that she has been taking Trazodone and had ?less nightmares? with it and saw the medication prescriber again being advised to double it to 100 mg. She says that she is walking in the evenings and plans on walking with her mom and some of her mom's friends in the mornings with the thought of perhaps eventually getting back into running. She says that she is somewhat better than a few weeks ago but still struggling at times which is born out by her PHQ-9 score being a ?13? today down from a previous high of 23. The major focus of the session involves Kathy Graham's need to see that she had valid reasons for leaving the job at [employer] and in practicing self-care. She says that  she had only two tele-psychiatry appointments through her insurance and that her PCP will continue her Trazodone. Kathy Graham says that some former co-workers have reached out to her. She says that she could likely contact them in the future about getting together once she is able to do so without crying.  ?  ?Suicidal/Homicidal: No suicidal or homicidal ideation reported ?  ?Plan: Return again in 1 week. ?  ?Diagnosis: Severe major depression without psychotic features ?  ?Collaboration of Care: N/A ?  ?Patient/Guardian was advised Release of Information must be obtained prior to any record release in order to collaborate their care with an outside provider. Patient/Guardian was advised if they have not already done so to contact the registration department to sign all necessary forms in order for [place] to release information regarding their care.  ?  ?Consent: Patient/Guardian gives verbal consent for treatment and assignment of benefits for services provided during this visit. Patient/Guardian expressed understanding and agreed to proceed.  ?  ?Adam Phenix, MA, LCSW, [facility], LCAS ?10/09/2021 ?

## 2021-10-10 ENCOUNTER — Other Ambulatory Visit: Payer: Self-pay

## 2021-10-10 DIAGNOSIS — Z13220 Encounter for screening for lipoid disorders: Secondary | ICD-10-CM

## 2021-10-10 DIAGNOSIS — K219 Gastro-esophageal reflux disease without esophagitis: Secondary | ICD-10-CM

## 2021-10-10 DIAGNOSIS — D649 Anemia, unspecified: Secondary | ICD-10-CM

## 2021-10-10 DIAGNOSIS — F419 Anxiety disorder, unspecified: Secondary | ICD-10-CM

## 2021-10-10 DIAGNOSIS — Z0000 Encounter for general adult medical examination without abnormal findings: Secondary | ICD-10-CM

## 2021-10-10 DIAGNOSIS — Z1321 Encounter for screening for nutritional disorder: Secondary | ICD-10-CM

## 2021-10-13 ENCOUNTER — Encounter: Payer: BC Managed Care – PPO | Admitting: Family Medicine

## 2021-10-21 ENCOUNTER — Encounter: Payer: BC Managed Care – PPO | Admitting: Family Medicine

## 2021-10-23 ENCOUNTER — Ambulatory Visit (INDEPENDENT_AMBULATORY_CARE_PROVIDER_SITE_OTHER): Payer: BC Managed Care – PPO | Admitting: Licensed Clinical Social Worker

## 2021-10-23 DIAGNOSIS — F322 Major depressive disorder, single episode, severe without psychotic features: Secondary | ICD-10-CM | POA: Diagnosis not present

## 2021-10-23 NOTE — Progress Notes (Signed)
THERAPIST PROGRESS NOTE ?  ?Session Time: 11 a.m. to 12 p.m.  ?  ?Type of Therapy: Individual ?  ?Treatment Goals addressed: Kathy Graham will experience a decrease in her depressive symptoms as evidenced by her PHQ-9 score dropping from a 23 to a 4 or less as of 03/25/2022. ?  ?Progress Towards Goals: Progressing ?  ?Interventions/Therapist Response: Solution-Focused Therapy/The therapist has Kathy Graham take a free online career test indicating that she falls mostly in the Social and Enterprising domains suggesting careers that fall under this category. The therapist provides her with the contact number for [facility] Career Planning Office suggesting that if they cannot help her with Career testing, planning, and placement that they likely could recommend someone who could. The therapist also provides her with the address for the Employment [employer]. The therapist notes that Kathy Graham can focus on researching what she wants to do and what is out there versus actually looking at this time until her symptoms are better controlled. He suggests that it may be time to talk to her medication prescriber about increasing her Trazodone from 100 mg to a therapeutic range of 150 mg as she is tolerating it well now.  ?  ?Summary: Kathy Graham presents today saying that the "good news" is that she has not really been "looping" as she now knows why she left [employer]. She reports some improvement in her mood noting that she has become tearful a couple of times since returning from vacation. Her PHQ-9 has dropped from a 13 at her last session down to a 9 today. Her GAD-7 has remain unchanged at a 13. The major focus of the session involves Kathy Graham's career plans moving forward and whether she is ready to start looking for a job or not. After Kathy Graham completes the on-line test, she notes that she would not mind having a job like a Media planner, Heritage manager, or teaching job at [employer] as she  likes interacting with and helping people and wants a job with structure and set hours.  ?  ?Suicidal/Homicidal: No suicidal or homicidal ideation reported ?  ?Plan: Return again in 1 week. ?  ?Diagnosis: Severe major depression without psychotic features ?  ?Collaboration of Care: N/A ?  ?Patient/Guardian was advised Release of Information must be obtained prior to any record release in order to collaborate their care with an outside provider. Patient/Guardian was advised if they have not already done so to contact the registration department to sign all necessary forms in order for [place] to release information regarding their care.  ?  ?Consent: Patient/Guardian gives verbal consent for treatment and assignment of benefits for services provided during this visit. Patient/Guardian expressed understanding and agreed to proceed.  ?  ?Adam Phenix, MA, LCSW, [facility], LCAS ?10/23/2021 ?

## 2021-10-29 DIAGNOSIS — Z13220 Encounter for screening for lipoid disorders: Secondary | ICD-10-CM | POA: Diagnosis not present

## 2021-10-29 DIAGNOSIS — K219 Gastro-esophageal reflux disease without esophagitis: Secondary | ICD-10-CM | POA: Diagnosis not present

## 2021-10-29 DIAGNOSIS — Z0000 Encounter for general adult medical examination without abnormal findings: Secondary | ICD-10-CM | POA: Diagnosis not present

## 2021-10-29 DIAGNOSIS — F419 Anxiety disorder, unspecified: Secondary | ICD-10-CM | POA: Diagnosis not present

## 2021-10-30 ENCOUNTER — Ambulatory Visit (INDEPENDENT_AMBULATORY_CARE_PROVIDER_SITE_OTHER): Payer: BC Managed Care – PPO | Admitting: Licensed Clinical Social Worker

## 2021-10-30 ENCOUNTER — Encounter: Payer: Self-pay | Admitting: Internal Medicine

## 2021-10-30 ENCOUNTER — Other Ambulatory Visit: Payer: Self-pay | Admitting: Gastroenterology

## 2021-10-30 ENCOUNTER — Encounter: Payer: BC Managed Care – PPO | Admitting: Family Medicine

## 2021-10-30 DIAGNOSIS — F322 Major depressive disorder, single episode, severe without psychotic features: Secondary | ICD-10-CM

## 2021-10-30 LAB — COMPREHENSIVE METABOLIC PANEL
ALT: 13 IU/L (ref 0–32)
AST: 18 IU/L (ref 0–40)
Albumin/Globulin Ratio: 1.6 (ref 1.2–2.2)
Albumin: 4.1 g/dL (ref 3.8–4.8)
Alkaline Phosphatase: 59 IU/L (ref 44–121)
BUN/Creatinine Ratio: 20 (ref 9–23)
BUN: 14 mg/dL (ref 6–24)
Bilirubin Total: <0.2 mg/dL (ref 0.0–1.2)
CO2: 20 mmol/L (ref 20–29)
Calcium: 9.1 mg/dL (ref 8.7–10.2)
Chloride: 102 mmol/L (ref 96–106)
Creatinine, Ser: 0.71 mg/dL (ref 0.57–1.00)
Globulin, Total: 2.6 g/dL (ref 1.5–4.5)
Glucose: 94 mg/dL (ref 70–99)
Potassium: 4.4 mmol/L (ref 3.5–5.2)
Sodium: 138 mmol/L (ref 134–144)
Total Protein: 6.7 g/dL (ref 6.0–8.5)
eGFR: 108 mL/min/{1.73_m2} (ref >59)

## 2021-10-30 LAB — TSH: TSH: 1.9 u[IU]/mL (ref 0.450–4.500)

## 2021-10-30 LAB — CBC WITH DIFFERENTIAL/PLATELET
Basophils Absolute: 0.1 10*3/uL (ref 0.0–0.2)
Basos: 1 %
EOS (ABSOLUTE): 0.2 10*3/uL (ref 0.0–0.4)
Eos: 3 %
Hematocrit: 32.5 % — ABNORMAL LOW (ref 34.0–46.6)
Hemoglobin: 10.6 g/dL — ABNORMAL LOW (ref 11.1–15.9)
Immature Grans (Abs): 0 10*3/uL (ref 0.0–0.1)
Immature Granulocytes: 0 %
Lymphocytes Absolute: 1.4 10*3/uL (ref 0.7–3.1)
Lymphs: 23 %
MCH: 28.8 pg (ref 26.6–33.0)
MCHC: 32.6 g/dL (ref 31.5–35.7)
MCV: 88 fL (ref 79–97)
Monocytes Absolute: 0.4 10*3/uL (ref 0.1–0.9)
Monocytes: 7 %
Neutrophils Absolute: 4.1 10*3/uL (ref 1.4–7.0)
Neutrophils: 66 %
Platelets: 319 10*3/uL (ref 150–450)
RBC: 3.68 x10E6/uL — ABNORMAL LOW (ref 3.77–5.28)
RDW: 12.3 % (ref 11.7–15.4)
WBC: 6.2 10*3/uL (ref 3.4–10.8)

## 2021-10-30 LAB — VITAMIN D 25 HYDROXY (VIT D DEFICIENCY, FRACTURES): Vit D, 25-Hydroxy: 39.8 ng/mL (ref 30.0–100.0)

## 2021-10-30 LAB — LIPID PANEL
Chol/HDL Ratio: 2.3 ratio (ref 0.0–4.4)
Cholesterol, Total: 202 mg/dL — ABNORMAL HIGH (ref 100–199)
HDL: 89 mg/dL (ref >39)
LDL Chol Calc (NIH): 101 mg/dL — ABNORMAL HIGH (ref 0–99)
Triglycerides: 68 mg/dL (ref 0–149)
VLDL Cholesterol Cal: 12 mg/dL (ref 5–40)

## 2021-10-30 NOTE — Progress Notes (Signed)
THERAPIST PROGRESS NOTE ?  ?Session Time: 9 a.m. to 10 a.m.  ?  ?Type of Therapy: Individual ?  ?Treatment Goals addressed: Elverta will experience a decrease in her depressive symptoms as evidenced by her PHQ-9 score dropping from a 23 to a 4 or less as of 03/25/2022. ?  ?Progress Towards Goals: Progressing ?  ?Interventions/Therapist  ?Response: Solution-Focused Therapy/The therapist provides feedback regarding the job for which Rayley is interviewing today based on the 45 minute commute and information that he pulls up on the web posted by former employees. The therapist suggests that based on her history that Shaguana might want to focus her job search more in the [place] area versus some of the rural areas in [place]. The therapist makes her aware of the book, Knock em Dead, as a good resource for upgrading her interviewing skills and understanding how the process really works. The therapist notes that when Kaytie got the puppy in the past that she was living alone and working over 50 hours with having a puppy being much like having a newborn baby. The therapist suggests that Jenifer will need to evaluate whether she has the time necessary to devote to a puppy should she consider getting one again in the future. The therapist also recommends the book, Mother Knows Best: The Natural Way to Train Your Dog, as a Education administrator a puppy which requires a lot of time, energy, and money.  ?  ?Summary: Navi presents saying that her mood has been a little better than when she was last here. She has been walking daily and went kayaking once with family. She says that yesterday was a bad day as she was missing her brother and "everything."  She had to reschedule her PCP visit but plans on asking about having her Trazodone increased to 150 mg. The major focus of the session involves Shruti's continued job search and decision regarding where to go with her career. Towards the end of the session  she asks if this therapist believes she will ever be able to have a dog. She attempted previously to have a dog getting a puppy on impulse noting that she ended up crying all of the time.  ? ?Suicidal/Homicidal: No suicidal or homicidal ideation reported ?  ?Plan: Return again in 1 week. ?  ?Diagnosis: Severe major depression without psychotic features ?  ?Collaboration of Care: N/A ?  ?Patient/Guardian was advised Release of Information must be obtained prior to any record release in order to collaborate their care with an outside provider. Patient/Guardian was advised if they have not already done so to contact the registration department to sign all necessary forms in order for [place] to release information regarding their care.  ?  ?Consent: Patient/Guardian gives verbal consent for treatment and assignment of benefits for services provided during this visit. Patient/Guardian expressed understanding and agreed to proceed.  ?  ?Adam Phenix, MA, LCSW, [facility], LCAS ?10/30/2021 ?

## 2021-10-30 NOTE — Telephone Encounter (Signed)
One refill provided since we technically saw her at time of colonoscopy 10/26/19.  ?She will need ov before further refills. ?

## 2021-10-31 NOTE — Addendum Note (Signed)
Addended by: Dairl Ponder on: 10/31/2021 10:04 AM ? ? Modules accepted: Orders ? ?

## 2021-10-31 NOTE — Addendum Note (Signed)
Addended by: Dairl Ponder on: 10/31/2021 10:12 AM ? ? Modules accepted: Orders ? ?

## 2021-11-03 ENCOUNTER — Ambulatory Visit (INDEPENDENT_AMBULATORY_CARE_PROVIDER_SITE_OTHER): Payer: BC Managed Care – PPO | Admitting: Family Medicine

## 2021-11-03 DIAGNOSIS — Z0000 Encounter for general adult medical examination without abnormal findings: Secondary | ICD-10-CM

## 2021-11-03 MED ORDER — BUSPIRONE HCL 10 MG PO TABS: 10.0000 mg | ORAL_TABLET | Freq: Three times a day (TID) | ORAL | 1 refills | Status: DC

## 2021-11-03 MED ORDER — TRAZODONE HCL 150 MG PO TABS: 150.0000 mg | ORAL_TABLET | Freq: Every day | ORAL | 1 refills | Status: DC

## 2021-11-03 NOTE — Patient Instructions (Addendum)
Labs around June 10th (doesn't have to be precise). ? ?I have refilled your medication. ? ?Follow up in 6 months to 1 year. ?

## 2021-11-03 NOTE — Telephone Encounter (Signed)
Pt was made aware and was transferred to the front to schedule an appt.  ?

## 2021-11-04 ENCOUNTER — Other Ambulatory Visit: Payer: Self-pay

## 2021-11-04 DIAGNOSIS — Z0000 Encounter for general adult medical examination without abnormal findings: Secondary | ICD-10-CM | POA: Insufficient documentation

## 2021-11-04 MED ORDER — TRAZODONE HCL 150 MG PO TABS: 150.0000 mg | ORAL_TABLET | Freq: Every day | ORAL | 1 refills | Status: DC

## 2021-11-04 NOTE — Telephone Encounter (Signed)
Patient called and states needs a trazadone to be sent to the [facility] on [address] , cancelled mail order .  ?

## 2021-11-04 NOTE — Progress Notes (Signed)
? ?Subjective:  ?Patient ID: Kathy Graham, female    DOB: 1981-03-24  Age: 41 y.o. MRN: [id number] ? ?CC: ?Chief Complaint  ?Patient presents with  ? Annual Exam  ? ? ?HPI: ? ?41 year old female presents for an annual physical exam. ? ?Patient has been struggling with depression and anxiety/adjustment reaction.  She states that she seems to be doing better and has improved with counseling.  Patient is in need of medication refill regarding her BuSpar and trazodone. ? ?Patient's preventative healthcare is mostly up-to-date.  Colonoscopy and Pap smear up-to-date.   ?Declines HIV and hepatitis C screening as she is not high risk. ? ?Patient has had recent laboratory studies.  Labs have been reviewed with the patient.  LDL mildly elevated at 101.  Patient was found to be anemic with a hemoglobin of 10.6.  Patient states that she had given blood prior to this and believes that this is a false representation of her baseline hemoglobin.  I have recommended repeat labs in the near future. ? ?Patient Active Problem List  ? Diagnosis Date Noted  ? Annual physical exam 11/04/2021  ? Adjustment reaction with anxiety and depression 08/12/2021  ? Colon cancer screening   ? GERD (gastroesophageal reflux disease) 04/28/2019  ? ? ?Social Hx   ?Social History  ? ?Socioeconomic History  ? Marital status: Unknown  ?  Spouse name: Not on file  ? Number of children: Not on file  ? Years of education: Not on file  ? Highest education level: Not on file  ?Occupational History  ? Not on file  ?Tobacco Use  ? Smoking status: Never  ? Smokeless tobacco: Never  ?Vaping Use  ? Vaping Use: Not on file  ?Substance and Sexual Activity  ? Alcohol use: Yes  ?  Comment: occ glass of wine  ? Drug use: No  ? Sexual activity: Not Currently  ?  Birth control/protection: Pill  ?Other Topics Concern  ? Not on file  ?Social History Narrative  ? Not on file  ? ?Social Determinants of Health  ? ?Financial Resource Strain: Not on file  ?Food Insecurity: Not  on file  ?Transportation Needs: Not on file  ?Physical Activity: Not on file  ?Stress: Not on file  ?Social Connections: Not on file  ? ? ?Review of Systems  ?Constitutional:  Positive for fatigue.  ?Psychiatric/Behavioral:  Positive for sleep disturbance. The patient is nervous/anxious.   ? ? ?Objective:  ?BP 118/76   Pulse 91   Temp 98.1 ?F (36.7 ?C)   Ht '5\' 3"'$  (1.6 m)   Wt 129 lb (58.5 kg)   SpO2 100%   BMI 22.85 kg/m?  ? ? ?  11/03/2021  ?  1:35 PM 09/22/2021  ?  2:25 PM 09/19/2021  ?  5:36 PM  ?BP/Weight  ?Systolic BP 315 176 160  ?Diastolic BP 76 76 78  ?Wt. (Lbs) 129 123   ?BMI 22.85 kg/m2 21.79 kg/m2   ? ? ?Physical Exam ?Vitals and nursing note reviewed.  ?Constitutional:   ?   General: She is not in acute distress. ?   Appearance: Normal appearance.  ?HENT:  ?   Head: Normocephalic and atraumatic.  ?Eyes:  ?   General:     ?   Right eye: No discharge.     ?   Left eye: No discharge.  ?   Conjunctiva/sclera: Conjunctivae normal.  ?Cardiovascular:  ?   Rate and Rhythm: Normal rate and regular rhythm.  ?Pulmonary:  ?  Effort: Pulmonary effort is normal.  ?   Breath sounds: Normal breath sounds. No wheezing, rhonchi or rales.  ?Abdominal:  ?   General: There is no distension.  ?   Palpations: Abdomen is soft.  ?   Tenderness: There is no abdominal tenderness.  ?Neurological:  ?   Mental Status: She is alert.  ?Psychiatric:  ?   Comments: Nervous, anxious.  ? ? ?Lab Results  ?Component Value Date  ? WBC 6.2 10/29/2021  ? HGB 10.6 (L) 10/29/2021  ? HCT 32.5 (L) 10/29/2021  ? PLT 319 10/29/2021  ? GLUCOSE 94 10/29/2021  ? CHOL 202 (H) 10/29/2021  ? TRIG 68 10/29/2021  ? HDL 89 10/29/2021  ? LDLCALC 101 (H) 10/29/2021  ? ALT 13 10/29/2021  ? AST 18 10/29/2021  ? NA 138 10/29/2021  ? K 4.4 10/29/2021  ? CL 102 10/29/2021  ? CREATININE 0.71 10/29/2021  ? BUN 14 10/29/2021  ? CO2 20 10/29/2021  ? TSH 1.900 10/29/2021  ? ? ? ?Assessment & Plan:  ? ?Problem List Items Addressed This Visit   ? ?  ? Other  ? Annual  physical exam  ?  Healthcare maintenance updated today. ?Recent labs reviewed with the patient.  Patient will have upcoming repeat CBC as well as iron, TIBC, ferritin, B12, and folate to further evaluate anemia. ?Patient is to continue her current medications. ? ?  ?  ? ? ?Meds ordered this encounter  ?Medications  ? DISCONTD: busPIRone (BUSPAR) 10 MG tablet  ?  Sig: Take 1 tablet (10 mg total) by mouth 3 (three) times daily.  ?  Dispense:  270 tablet  ?  Refill:  1  ? traZODone (DESYREL) 150 MG tablet  ?  Sig: Take 1 tablet (150 mg total) by mouth at bedtime.  ?  Dispense:  90 tablet  ?  Refill:  1  ? busPIRone (BUSPAR) 10 MG tablet  ?  Sig: Take 1 tablet (10 mg total) by mouth 3 (three) times daily.  ?  Dispense:  270 tablet  ?  Refill:  1  ? ? ?Follow-up:  Return in about 6 months (around 05/06/2022). ? ?Thersa Salt DO ?[facility] ? ?

## 2021-11-04 NOTE — Assessment & Plan Note (Signed)
Healthcare maintenance updated today. ?Recent labs reviewed with the patient.  Patient will have upcoming repeat CBC as well as iron, TIBC, ferritin, B12, and folate to further evaluate anemia. ?Patient is to continue her current medications. ?

## 2021-11-06 ENCOUNTER — Ambulatory Visit (INDEPENDENT_AMBULATORY_CARE_PROVIDER_SITE_OTHER): Payer: BC Managed Care – PPO | Admitting: Licensed Clinical Social Worker

## 2021-11-06 DIAGNOSIS — F322 Major depressive disorder, single episode, severe without psychotic features: Secondary | ICD-10-CM

## 2021-11-06 NOTE — Progress Notes (Signed)
THERAPIST PROGRESS NOTE   Session Time: 3 p.m. to 4 p.m.   Type of Therapy: Individual   Treatment Goals addressed: Clotile will experience a decrease in her depressive symptoms as evidenced by her PHQ-9 score dropping from a 23 to a 4 or less as of 03/25/2022.   Progress Towards Goals: Progressing   Interventions/Therapist  Response: Cognitive-behavioral therapy: make the observation that Bailea has jumped ahead of herself in looking for work versus researching jobs as she intended. The therapist informs her that she is now on a therapeutic dose of Trazodone which may take up to four weeks to work. He addresses her fears about money as she is actually in a good position financially. The therapist helps her to decide to attend a Job Fair tonight at [facility] to get information about the teaching job noting that she does not have to apply. The therapist educates her on what a Job Fair is as she has never been to one.    Summary: Renu says that she went to the job interview which was as expected in that she would have to work Saturdays and  possibly have to fill in for 3rd shifts if people called out. 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 When the therapist points out that she was supposed to be researching jobs until better versus applying, she admits that she panics worrying that nothing will be out there so has gotten ahead of herself. She has been reading the book, Knock em Dead.  She feels like she is worse than the week before. She had to go pack up the town home in [place]. Her GAD-7 is a 12 and PHQ-9 remains at a 9. Since Monday night, she has been taking 150 mg of Trazodone saying that she does not feel like she is sleeping a deeply but is able to sleep. Her Fitbit told her that she got deep sleep last night.   Suicidal/Homicidal: No suicidal or homicidal ideation reported   Plan: Return again  in 1 week.   Diagnosis: Severe major depression without psychotic features   Collaboration of Care: N/A   Patient/Guardian was advised Release of Information must be obtained prior to any record release in order to collaborate their care with an outside provider. Patient/Guardian was advised if they have not already done so to contact the registration department to sign all necessary forms in order for [place] to release information regarding their care.    Consent: Patient/Guardian gives verbal consent for treatment and assignment of benefits for services provided during this visit. Patient/Guardian expressed understanding and agreed to proceed.    Adam Phenix, [place], LCSW, [facility], [place] 11/06/2021

## 2021-11-13 ENCOUNTER — Ambulatory Visit (HOSPITAL_COMMUNITY): Payer: BC Managed Care – PPO | Admitting: Licensed Clinical Social Worker

## 2021-11-14 NOTE — Progress Notes (Unsigned)
GI Office Note    Referring Provider: Coral Spikes, DO Primary Care Physician:  Coral Spikes, DO Primary GI: Dr. Abbey Chatters  Date:  11/18/2021  ID:  Kathy Graham, DOB 01/10/1981, MRN [id number]   Chief Complaint   Chief Complaint  Patient presents with   Follow-up    GERD      History of Present Illness  Kathy Graham is a 41 y.o. female with a history of GERD, anxiety, depression, and food impaction in June 2020 presenting today  for follow up.  Last EGD June 2020 - web in the distal esophagus s/p dilation, esophageal biopsies consistent with gastritis. H. Pylori negative.   Last seen in the office 04/28/2019. She had allergy testing done. Found to be allergic to 27 foods and was avoiding those and had improved swallowing. Heartburn improved. No bowel complaints. Was taking pantoprazole every other day. Reported both parents with colon polyps.   Last Colonoscopy 10/26/2019 - 5 mm polyp in the proximal descending colon (tubular adenoma), mildly tortuous colon, internal hemorrhoids. Recommended high fiber diet, repeat recommended 2026-2028.    Today:  GERD - taking pantoprazole 40 mg every day and no more issues, takes Jelusil as needed as well. Had allergy testing done as well and as long as she adheres to that. Food triggers/allergies are soys, nuts, beef, and chicken. Denies N/V, abdominal pain. Denies frequent coughing, does belch with hot chocolate or carbonated drink.   Dysphagia - no issues, even okay to swallow a large medication she has.  Denies changes in bowel habits, constipation, diarrhea, melena, hematochezia.   Past Medical History:  Diagnosis Date   Reflux     Past Surgical History:  Procedure Laterality Date   BACK SURGERY     BIOPSY  12/16/2018   Procedure: BIOPSY;  Surgeon: Danie Binder, MD;  [facility];  Service: Endoscopy;;  esophagus gastric    COLONOSCOPY N/A 10/26/2019   Procedure: COLONOSCOPY;  Surgeon: Danie Binder, MD;   [facility];  Service: Endoscopy;  Laterality: N/A;  930am   ESOPHAGOGASTRODUODENOSCOPY N/A 12/16/2018   Dr. Oneida Alar: Patchy mild inflammation characterized by congestion and erythema in the gastric body and gastric antrum.  Web found in the distal esophagus status post dilation.  Gastric biopsies showed gastritis but no H. pylori.  Esophageal biopsy showed reflux.   POLYPECTOMY  10/26/2019   Procedure: POLYPECTOMY;  Surgeon: Danie Binder, MD;  [facility];  Service: Endoscopy;;  descending    Current Outpatient Medications  Medication Sig Dispense Refill   busPIRone (BUSPAR) 10 MG tablet Take 1 tablet (10 mg total) by mouth 3 (three) times daily. 270 tablet 1   drospirenone-ethinyl estradiol (YAZ,GIANVI,LORYNA) 3-0.02 MG tablet Take 1 tablet by mouth daily.     fexofenadine (ALLEGRA) 180 MG tablet Take 180 mg by mouth daily.     pantoprazole (PROTONIX) 40 MG tablet TAKE 1 TABLET DAILY BEFORE BREAKFAST 90 tablet 0   traZODone (DESYREL) 150 MG tablet Take 1 tablet (150 mg total) by mouth at bedtime. 90 tablet 1   triamcinolone (NASACORT ALLERGY 24HR) 55 MCG/ACT AERO nasal inhaler Place 2 sprays into the nose daily. 1 each 3   No current facility-administered medications for this visit.    Allergies as of 11/18/2021 - Review Complete 11/03/2021  Allergen Reaction Noted   Augmentin [amoxicillin-pot clavulanate] Nausea And Vomiting 12/01/2012   Cefzil [cefprozil]  12/01/2012    Family History  Problem Relation Age  of Onset   Hyperlipidemia Mother    Colon polyps Mother        before age 21   Hypothyroidism Mother    Hypertension Father    Colon polyps Father        before age 37   Cancer Brother 24       melanoma   Colon cancer Other     Social History   Socioeconomic History   Marital status: Unknown    Spouse name: Not on file   Number of children: Not on file   Years of education: Not on file   Highest education level: Not on file  Occupational  History   Not on file  Tobacco Use   Smoking status: Never   Smokeless tobacco: Never  Vaping Use   Vaping Use: Not on file  Substance and Sexual Activity   Alcohol use: Yes    Comment: occ glass of wine   Drug use: No   Sexual activity: Not Currently    Birth control/protection: Pill  Other Topics Concern   Not on file  Social History Narrative   Not on file   Social Determinants of Health   Financial Resource Strain: Not on file  Food Insecurity: Not on file  Transportation Needs: Not on file  Physical Activity: Not on file  Stress: Not on file  Social Connections: Not on file     Review of Systems   Gen: Denies fever, chills, anorexia. Denies fatigue, weakness, weight loss.  CV: Denies chest pain, palpitations, syncope, peripheral edema, and claudication. Resp: Denies dyspnea at rest, cough, wheezing, coughing up blood, and pleurisy. GI: see HPI Derm: Denies rash, itching, dry skin Psych: Denies depression, anxiety, memory loss, confusion. No homicidal or suicidal ideation.  Heme: Denies bruising, bleeding, and enlarged lymph nodes.   Physical Exam   BP 114/60   Pulse 84   Temp (!) 97.5 F (36.4 C) (Temporal)   Ht '5\' 4"'$  (1.626 m)   Wt 132 lb 6.4 oz (60.1 kg)   BMI 22.73 kg/m   General:   Alert and oriented. No distress noted. Pleasant and cooperative.  Head:  Normocephalic and atraumatic. Eyes:  Conjuctiva clear without scleral icterus. Mouth:  Oral mucosa pink and moist. Good dentition. No lesions. Lungs:  Clear to auscultation bilaterally. No wheezes, rales, or rhonchi. No distress.  Heart:  S1, S2 present without murmurs appreciated.  Abdomen:  +BS, soft, non-tender and non-distended. No rebound or guarding. No HSM or masses noted. Rectal: deferred Msk:  Symmetrical without gross deformities. Normal posture. Extremities:  Without edema. Neurologic:  Alert and  oriented x4 Psych:  Alert and cooperative. Normal mood and affect.   Assessment   Kathy Graham is a 41 y.o. female with a history of  anxiety, MDD, GERD, and food impaction presenting today for follow up of GERD.   GERD: Doing fairly well also if she avoids her food triggers.  Reports symptoms have been improved since her allergy testing.  Denies any further issues of dysphagia after last EGD with dilation. Has been taking pantoprazole 40 mg daily, having to use over-the-counter antacid fairly frequently.  Discussed GERD lifestyle modifications.  Advised her to increase pantoprazole to 40 mg twice daily, call with a progress report in 2 to 3 weeks.  May continue to take over-the-counter antacid as needed.  Discussed long-term side effects of PPI usage, advised that she could supplement her diet with vitamin D along with calcium to aid in prevention  of osteoporosis.   PLAN   Increase pantoprazole to 40 mg BID, call with progress report in 2-3 weeks.  Continue GERD lifestyle modifications Continue to avoid food triggers Recommend Vitamin D supplementation along with calcium to help offset long term effect of PPI F/u in 6 months to 1 year, or sooner if needed. (Virtual visit acceptable.)   Venetia Night, MSN, FNP-BC, AGACNP-BC [facility]

## 2021-11-18 ENCOUNTER — Ambulatory Visit (INDEPENDENT_AMBULATORY_CARE_PROVIDER_SITE_OTHER): Payer: BC Managed Care – PPO | Admitting: Gastroenterology

## 2021-11-18 ENCOUNTER — Encounter: Payer: Self-pay | Admitting: Gastroenterology

## 2021-11-18 VITALS — BP 114/60 | HR 84 | Temp 97.5°F | Ht 64.0 in | Wt 132.4 lb

## 2021-11-18 DIAGNOSIS — K219 Gastro-esophageal reflux disease without esophagitis: Secondary | ICD-10-CM | POA: Diagnosis not present

## 2021-11-18 MED ORDER — PANTOPRAZOLE SODIUM 40 MG PO TBEC: 40.0000 mg | DELAYED_RELEASE_TABLET | Freq: Every day | ORAL | 3 refills | Status: DC

## 2021-11-18 MED ORDER — PANTOPRAZOLE SODIUM 40 MG PO TBEC: 40.0000 mg | DELAYED_RELEASE_TABLET | Freq: Two times a day (BID) | ORAL | 3 refills | Status: DC

## 2021-11-18 NOTE — Patient Instructions (Signed)
You should increase her pantoprazole to 40 mg twice daily, 30 minutes prior to breakfast and dinner.  Please call with a progress report in 2 to 3 weeks.  Continue to avoid your food triggers, you may use your over-the-counter antacid for breakthrough symptoms as needed.  As we discussed you could supplement with vitamin D to help offset any potential osteoporosis.  Follow-up in 6 months to 1 year.  It was a pleasure to meet you today!  I want to create trusting relationships with patients. If you receive a survey regarding your visit,  I greatly appreciate you taking time to fill this out on paper or through your MyChart. I value your feedback.  Venetia Night, MSN, FNP-BC, AGACNP-BC [facility]

## 2021-11-20 ENCOUNTER — Ambulatory Visit (HOSPITAL_COMMUNITY): Payer: BC Managed Care – PPO | Admitting: Licensed Clinical Social Worker

## 2021-11-25 DIAGNOSIS — D649 Anemia, unspecified: Secondary | ICD-10-CM | POA: Diagnosis not present

## 2021-11-26 ENCOUNTER — Other Ambulatory Visit: Payer: Self-pay | Admitting: Family Medicine

## 2021-11-26 LAB — CBC WITH DIFFERENTIAL/PLATELET
Basophils Absolute: 0.1 10*3/uL (ref 0.0–0.2)
Basos: 1 %
EOS (ABSOLUTE): 0.2 10*3/uL (ref 0.0–0.4)
Eos: 3 %
Hematocrit: 33.6 % — ABNORMAL LOW (ref 34.0–46.6)
Hemoglobin: 10.9 g/dL — ABNORMAL LOW (ref 11.1–15.9)
Immature Grans (Abs): 0 10*3/uL (ref 0.0–0.1)
Immature Granulocytes: 0 %
Lymphocytes Absolute: 1.5 10*3/uL (ref 0.7–3.1)
Lymphs: 19 %
MCH: 28.2 pg (ref 26.6–33.0)
MCHC: 32.4 g/dL (ref 31.5–35.7)
MCV: 87 fL (ref 79–97)
Monocytes Absolute: 0.5 10*3/uL (ref 0.1–0.9)
Monocytes: 7 %
Neutrophils Absolute: 5.5 10*3/uL (ref 1.4–7.0)
Neutrophils: 70 %
Platelets: 302 10*3/uL (ref 150–450)
RBC: 3.87 x10E6/uL (ref 3.77–5.28)
RDW: 11.9 % (ref 11.7–15.4)
WBC: 7.8 10*3/uL (ref 3.4–10.8)

## 2021-11-26 LAB — FERRITIN: Ferritin: 13 ng/mL — ABNORMAL LOW (ref 15–150)

## 2021-11-26 LAB — IRON AND TIBC
Iron Saturation: 8 % — CL (ref 15–55)
Iron: 37 ug/dL (ref 27–159)
Total Iron Binding Capacity: 466 ug/dL — ABNORMAL HIGH (ref 250–450)
UIBC: 429 ug/dL — ABNORMAL HIGH (ref 131–425)

## 2021-11-26 LAB — VITAMIN B12: Vitamin B-12: 364 pg/mL (ref 232–1245)

## 2021-11-26 LAB — FOLATE: Folate: >20 ng/mL (ref >3.0)

## 2021-11-26 MED ORDER — IRON (FERROUS SULFATE) 325 (65 FE) MG PO TABS: 325.0000 mg | ORAL_TABLET | ORAL | 3 refills | Status: DC

## 2021-11-27 ENCOUNTER — Ambulatory Visit (HOSPITAL_COMMUNITY): Payer: BC Managed Care – PPO | Admitting: Licensed Clinical Social Worker

## 2021-12-01 ENCOUNTER — Other Ambulatory Visit: Payer: Self-pay

## 2021-12-01 DIAGNOSIS — Z1321 Encounter for screening for nutritional disorder: Secondary | ICD-10-CM

## 2021-12-01 LAB — IFOBT (OCCULT BLOOD): IFOBT: NEGATIVE

## 2021-12-04 ENCOUNTER — Ambulatory Visit (INDEPENDENT_AMBULATORY_CARE_PROVIDER_SITE_OTHER): Payer: BC Managed Care – PPO | Admitting: Licensed Clinical Social Worker

## 2021-12-04 DIAGNOSIS — F322 Major depressive disorder, single episode, severe without psychotic features: Secondary | ICD-10-CM

## 2021-12-04 NOTE — Progress Notes (Signed)
THERAPIST PROGRESS NOTE   Session Time: 11:05 a.m. to 11:53 a.m.   Type of Therapy: Individual   Treatment Goals addressed: [name] will experience a decrease in her depressive symptoms as evidenced by her PHQ-9 score dropping from a 23 to a 4 or less as of 03/25/2022.   Progress Towards Goals: Progressing   Interventions/Therapist  Response: Reality therapy: The therapist informs [name] that she is at the target levels for her PHQ-9 and GAD-7 are both where they need to be and educates her about the need to stay on the medication for at least twelve months before considering trying to go off of it should she choose to do so.   He assists her in weighing the pros and cons of the job in [place] and makes the observation that she is excited about this job; however, does not have the same enthusiasm about the [employer] job. The only con for the [employer] job is being about 3 hours from her parents; however, she says that she could get a 2nd bedroom so they could come and visit and that they believe this job may be good for her.   The therapist validates that she is in a much better place to make an informed decision based on her PHQ-9 and GAD-7 scores.    Summary: Kathy Graham presents saying that she is feeling a lot better on the 150 mg of Trazodone. She also found out that she was anemic so started on some iron.  Her PHQ-9 and GAD-7 are both a "3" with Kathy Graham saying that she feels more like her normal self.  She had an interview for a teaching job with [employer] and her old company contacted her about a job at [employer] in [place], [place]. She went down there for an interview, lunch, etcetera and says that she should be receiving an offer from them tomorrow.   Suicidal/Homicidal: No suicidal or homicidal ideation reported   Plan: Return again in 1 week; she will check back in about what happens with the job and this could be her last session with this therapist.    Diagnosis: Severe major depression without psychotic features   Collaboration of Care: N/A   Patient/Guardian was advised Release of Information must be obtained prior to any record release in order to collaborate their care with an outside provider. Patient/Guardian was advised if they have not already done so to contact the registration department to sign all necessary forms in order for [place] to release information regarding their care.    Consent: Patient/Guardian gives verbal consent for treatment and assignment of benefits for services provided during this visit. Patient/Guardian expressed understanding and agreed to proceed.    Adam Phenix, [place], LCSW, [facility], [place] 12/04/2021

## 2021-12-11 ENCOUNTER — Ambulatory Visit (HOSPITAL_COMMUNITY): Payer: BC Managed Care – PPO | Admitting: Licensed Clinical Social Worker

## 2021-12-11 DIAGNOSIS — F322 Major depressive disorder, single episode, severe without psychotic features: Secondary | ICD-10-CM

## 2021-12-11 NOTE — Progress Notes (Signed)
THERAPIST PROGRESS NOTE   Session Time: 11 a.m. to 12 p.m.   Type of Therapy: Individual   Treatment Goals addressed: Haile will experience a decrease in her depressive symptoms as evidenced by her PHQ-9 score dropping from a 23 to a 4 or less as of 03/25/2022.   Progress Towards Goals: Progressing   Interventions/Therapist  Response: Reality therapy/CBT: The therapist assists Liviah in weighing the pros and cons of her current offers and suggests that she lacks enough data at this point to make a definitive decision about the [employer] job.  The therapist reminds Lennie that having a position without a healthy work/life balance is part of what precipitated her most recent depression.  He challenges her belief that her life is currently not "on track."    Summary: Vincentina returns saying that the job in [place] offered her 20% less than what she was making before and gave her less vacation. She says that she does not see how she can leave her family and make 20% less.  She would make less at [facility] but would only have to work 9 months out of the year. 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 She says that the adjunct teaching would lead to a path for full-time teaching either in January or August.   She has been contacted by some recruiters about some other possible jobs. She admits that she has felt more stressed this past week when she found out about the [place] position saying that she had thought that she was finally about to get her life back on track.   Suicidal/Homicidal: No suicidal or homicidal ideation reported   Plan: Return again in 1 week; she says that she would like to be seen in a week to help navigate everything going on with her job search.    Diagnosis: Severe major depression without psychotic features   Collaboration of Care: N/A    Patient/Guardian was advised Release of Information must be obtained prior to any record release in order to collaborate their care with an outside provider. Patient/Guardian was advised if they have not already done so to contact the registration department to sign all necessary forms in order for [place] to release information regarding their care.    Consent: Patient/Guardian gives verbal consent for treatment and assignment of benefits for services provided during this visit. Patient/Guardian expressed understanding and agreed to proceed.    Adam Phenix, [place], LCSW, [facility], [place] 12/11/2021

## 2021-12-16 DIAGNOSIS — L821 Other seborrheic keratosis: Secondary | ICD-10-CM | POA: Diagnosis not present

## 2021-12-16 DIAGNOSIS — Z1283 Encounter for screening for malignant neoplasm of skin: Secondary | ICD-10-CM | POA: Diagnosis not present

## 2021-12-18 ENCOUNTER — Ambulatory Visit (INDEPENDENT_AMBULATORY_CARE_PROVIDER_SITE_OTHER): Payer: BC Managed Care – PPO | Admitting: Licensed Clinical Social Worker

## 2021-12-18 DIAGNOSIS — F322 Major depressive disorder, single episode, severe without psychotic features: Secondary | ICD-10-CM

## 2021-12-18 NOTE — Progress Notes (Signed)
THERAPIST PROGRESS NOTE   Session Time:  3 p.m. to  4 p.m.   Type of Therapy: Individual   Treatment Goals addressed: Kathy Graham will experience a decrease in her depressive symptoms as evidenced by her PHQ-9 score dropping from a 23 to a 4 or less as of 03/25/2022.   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  He encourages to continue taking her psychotropic medications and spaces out her return therapy appointment about 3 weeks with the possibility of her going to p.r.n after this date.    Summary: Kathy Graham returns saying that her mood is better than last week. 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  She concludes that this will bring in some money while allowing her to put something on her resume and keep her eyes open for a "dream job" should one come open.   She reports that her mood is back where is was when she completed the PHQ-9 and GAD-7 on the 15th of this month.   Suicidal/Homicidal: No suicidal or homicidal ideation reported   Plan: Return again in 3 weeks  Diagnosis: Severe major depression without psychotic features   Collaboration of Care: N/A   Patient/Guardian was advised Release of Information must be obtained prior to any record release in order to collaborate their care with an outside provider. Patient/Guardian was advised if they have not already done so to contact the registration department to sign all necessary forms in order for [place] to release information regarding their care.    Consent: Patient/Guardian gives verbal consent for treatment and assignment of benefits for services provided during this  visit. Patient/Guardian expressed understanding and agreed to proceed.    Adam Phenix, [place], [place], [facility], [place] 12/18/2021

## 2021-12-24 ENCOUNTER — Telehealth (HOSPITAL_COMMUNITY): Payer: BC Managed Care – PPO | Admitting: Psychiatry

## 2021-12-29 ENCOUNTER — Telehealth: Payer: Self-pay | Admitting: *Deleted

## 2021-12-29 NOTE — Telephone Encounter (Signed)
Spoke to pt, she informed me that the Protonix is helping. She is feeling better. She also stated her PCP informed her that she has anemia and has started her on iron pills.

## 2022-01-08 ENCOUNTER — Telehealth: Payer: Self-pay

## 2022-01-08 ENCOUNTER — Ambulatory Visit (HOSPITAL_COMMUNITY): Payer: BC Managed Care – PPO | Admitting: Licensed Clinical Social Worker

## 2022-01-08 ENCOUNTER — Other Ambulatory Visit: Payer: Self-pay

## 2022-01-08 DIAGNOSIS — D649 Anemia, unspecified: Secondary | ICD-10-CM

## 2022-01-08 NOTE — Telephone Encounter (Signed)
Patient calls to ask recommendations regarding low iron studies, she still gets tired at times and a little light headed at times . Currently taking ferrous sulfate 325 mg 1 EOD , been taking for about 6 weeks now, please advise.

## 2022-01-08 NOTE — Telephone Encounter (Signed)
Patient has been informed per Dr's notes and recommendations.

## 2022-01-12 DIAGNOSIS — D649 Anemia, unspecified: Secondary | ICD-10-CM | POA: Diagnosis not present

## 2022-01-13 LAB — CBC WITH DIFFERENTIAL/PLATELET
Basophils Absolute: 0.1 10*3/uL (ref 0.0–0.2)
Basos: 1 %
EOS (ABSOLUTE): 0.2 10*3/uL (ref 0.0–0.4)
Eos: 3 %
Hematocrit: 35.3 % (ref 34.0–46.6)
Hemoglobin: 11.3 g/dL (ref 11.1–15.9)
Immature Grans (Abs): 0 10*3/uL (ref 0.0–0.1)
Immature Granulocytes: 0 %
Lymphocytes Absolute: 1.5 10*3/uL (ref 0.7–3.1)
Lymphs: 26 %
MCH: 27.6 pg (ref 26.6–33.0)
MCHC: 32 g/dL (ref 31.5–35.7)
MCV: 86 fL (ref 79–97)
Monocytes Absolute: 0.4 10*3/uL (ref 0.1–0.9)
Monocytes: 7 %
Neutrophils Absolute: 3.7 10*3/uL (ref 1.4–7.0)
Neutrophils: 63 %
Platelets: 265 10*3/uL (ref 150–450)
RBC: 4.09 x10E6/uL (ref 3.77–5.28)
RDW: 14.8 % (ref 11.7–15.4)
WBC: 5.8 10*3/uL (ref 3.4–10.8)

## 2022-01-13 LAB — IRON,TIBC AND FERRITIN PANEL
Ferritin: 32 ng/mL (ref 15–150)
Iron Saturation: 20 % (ref 15–55)
Iron: 80 ug/dL (ref 27–159)
Total Iron Binding Capacity: 392 ug/dL (ref 250–450)
UIBC: 312 ug/dL (ref 131–425)

## 2022-02-12 ENCOUNTER — Ambulatory Visit (HOSPITAL_COMMUNITY): Payer: BC Managed Care – PPO | Admitting: Licensed Clinical Social Worker

## 2022-02-19 ENCOUNTER — Ambulatory Visit (INDEPENDENT_AMBULATORY_CARE_PROVIDER_SITE_OTHER): Payer: BC Managed Care – PPO | Admitting: Licensed Clinical Social Worker

## 2022-02-19 DIAGNOSIS — F322 Major depressive disorder, single episode, severe without psychotic features: Secondary | ICD-10-CM | POA: Diagnosis not present

## 2022-02-19 NOTE — Progress Notes (Signed)
THERAPIST PROGRESS NOTE   Session Time:  9 a.m. to 9:55 a.m.   Type of Therapy: Individual   Treatment Goals addressed: Thailyn will experience a decrease in her depressive symptoms as evidenced by her PHQ-9 score dropping from a 23 to a 4 or less as of 03/25/2022.   Progress Towards Goals: Progressing   Interventions/Therapist  Response: Solution-Focused/The therapist suggests that Emmalin can reach out to former work friends at [employer] and ask them to go out to lunch, etc.  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   The therapist suggests that this job will likely be more stimulating and notes that if she connects with old work friends at [employer] while having a job that she loves and provides her with so much free time that she can have her cake and eat it too.    Summary: Jilliana returns having not seen this therapist since June. She is now teaching four classes. She says that the first week was "kind of rough;" however, after she had her first face-to-face classes, it got better.   She says that she was stressed out and crying a little. Now, she is doing better noting, "I like it; they have some great students."   She says that her father is concerned that she may get bored doing this job as he apparently did some teaching in his early 20's but did not like it.  Kada concludes that the thing she misses most about [employer] are the co-workers with whom she worked for years.  She had some anxiety-related to adjusting to her new job which led to her scheduling this appointment; however, she is back to baseline noting that her sleep and mood are both good.   As she is doing extremely well, she will return on a p.r.n. basis only.   Suicidal/Homicidal: No suicidal or homicidal ideation reported   Plan: Return again  ip.r.n.  Diagnosis: Severe major depression without psychotic features   Collaboration of Care: N/A   Patient/Guardian was advised Release of Information must be obtained prior to any record release in order to collaborate their care with an outside provider. Patient/Guardian was advised if they have not already done so to contact the registration department to sign all necessary forms in order for [place] to release information regarding their care.    Consent: Patient/Guardian gives verbal consent for treatment and assignment of benefits for services provided during this visit. Patient/Guardian expressed understanding and agreed to proceed.    Adam Phenix, [place], LCSW, [facility], [place] 02/19/2022

## 2022-03-15 ENCOUNTER — Telehealth: Payer: BC Managed Care – PPO | Admitting: Family

## 2022-03-15 DIAGNOSIS — J019 Acute sinusitis, unspecified: Secondary | ICD-10-CM | POA: Diagnosis not present

## 2022-03-15 MED ORDER — DOXYCYCLINE HYCLATE 100 MG PO TABS: 100.0000 mg | ORAL_TABLET | Freq: Two times a day (BID) | ORAL | 0 refills | Status: DC

## 2022-03-15 NOTE — Progress Notes (Signed)

## 2022-04-02 ENCOUNTER — Other Ambulatory Visit: Payer: Self-pay

## 2022-04-02 MED ORDER — IRON (FERROUS SULFATE) 325 (65 FE) MG PO TABS: 325.0000 mg | ORAL_TABLET | ORAL | 3 refills | Status: DC

## 2022-04-21 DIAGNOSIS — I781 Nevus, non-neoplastic: Secondary | ICD-10-CM | POA: Diagnosis not present

## 2022-04-21 DIAGNOSIS — L821 Other seborrheic keratosis: Secondary | ICD-10-CM | POA: Diagnosis not present

## 2022-04-22 ENCOUNTER — Encounter: Payer: Self-pay | Admitting: *Deleted

## 2022-05-04 ENCOUNTER — Other Ambulatory Visit: Payer: Self-pay | Admitting: Family Medicine

## 2022-05-06 ENCOUNTER — Encounter: Payer: Self-pay | Admitting: Family Medicine

## 2022-05-06 ENCOUNTER — Ambulatory Visit: Payer: BC Managed Care – PPO | Admitting: Family Medicine

## 2022-05-06 VITALS — BP 111/63 | HR 63 | Temp 98.3°F | Wt 133.6 lb

## 2022-05-06 DIAGNOSIS — J329 Chronic sinusitis, unspecified: Secondary | ICD-10-CM | POA: Diagnosis not present

## 2022-05-06 DIAGNOSIS — F419 Anxiety disorder, unspecified: Secondary | ICD-10-CM

## 2022-05-06 DIAGNOSIS — J309 Allergic rhinitis, unspecified: Secondary | ICD-10-CM

## 2022-05-06 DIAGNOSIS — F32A Depression, unspecified: Secondary | ICD-10-CM | POA: Diagnosis not present

## 2022-05-06 MED ORDER — AZELASTINE-FLUTICASONE 137-50 MCG/ACT NA SUSP: 1.0000 | Freq: Two times a day (BID) | NASAL | 1 refills | Status: DC

## 2022-05-06 NOTE — Assessment & Plan Note (Signed)
Stable at this time.  PHQ-9 score of 2.  GAD-7 score of 3.  Continue BuSpar.  Continue seeing counselor.

## 2022-05-06 NOTE — Assessment & Plan Note (Signed)
Starting on Dymista.  Referring to ENT.

## 2022-05-06 NOTE — Assessment & Plan Note (Signed)
Trial of Dymista.

## 2022-05-06 NOTE — Patient Instructions (Signed)
Medication as prescribed.  Referral to allergy placed.  Follow up in 6 months or sooner if needed.  Take care  Dr. Lacinda Axon

## 2022-05-06 NOTE — Progress Notes (Signed)
Subjective:  Patient ID: Kathy Graham, female    DOB: 02-20-1981  Age: 41 y.o. MRN: [id number]  CC: Chief Complaint  Patient presents with   Follow-up    Pt has began to run again and states she is having reoccurring sinus trouble    HPI:  41 year old female presents for follow-up.  Patient is seeing a Social worker.  She states that she is doing well regarding her depression.  Anxiety stable on BuSpar.  Patient reports that she has recently been having some issues with her sinuses.  She states that she has started running again and has a lot of drainage and postnasal drip.  She uses Nasacort and Allegra without resolution.  She states that she has had multiple sinus infections this year.  She has been seen by ear nose and throat.  Work-up has been unrevealing.  She states that she suffers from allergies.  This is likely the culprit.  Patient Active Problem List   Diagnosis Date Noted   Recurrent sinusitis 05/06/2022   Allergic rhinitis 05/06/2022   Annual physical exam 11/04/2021   Colon cancer screening    GERD (gastroesophageal reflux disease) 04/28/2019   Anxiety and depression 12/09/2012    Social Hx   Social History   Socioeconomic History   Marital status: Unknown    Spouse name: Not on file   Number of children: Not on file   Years of education: Not on file   Highest education level: Not on file  Occupational History   Not on file  Tobacco Use   Smoking status: Never   Smokeless tobacco: Never  Vaping Use   Vaping Use: Not on file  Substance and Sexual Activity   Alcohol use: Yes    Comment: occ glass of wine   Drug use: No   Sexual activity: Not Currently    Birth control/protection: Pill  Other Topics Concern   Not on file  Social History Narrative   Not on file   Social Determinants of Health   Financial Resource Strain: Not on file  Food Insecurity: Not on file  Transportation Needs: Not on file  Physical Activity: Not on file  Stress: Not on  file  Social Connections: Not on file    Review of Systems Per HPI  Objective:  BP 111/63   Pulse 63   Temp 98.3 F (36.8 C)   Wt 133 lb 9.6 oz (60.6 kg)   SpO2 100%   BMI 22.93 kg/m      05/06/2022    8:26 AM 11/18/2021    3:30 PM 11/03/2021    1:35 PM  BP/Weight  Systolic BP 027 741 287  Diastolic BP 63 60 76  Wt. (Lbs) 133.6 132.4 129  BMI 22.93 kg/m2 22.73 kg/m2 22.85 kg/m2    Physical Exam Vitals and nursing note reviewed.  Constitutional:      General: She is not in acute distress.    Appearance: Normal appearance.  HENT:     Head: Normocephalic and atraumatic.     Nose: Congestion present.     Mouth/Throat:     Pharynx: Oropharynx is clear.  Eyes:     General:        Right eye: No discharge.        Left eye: No discharge.     Conjunctiva/sclera: Conjunctivae normal.  Cardiovascular:     Rate and Rhythm: Normal rate and regular rhythm.  Pulmonary:     Effort: Pulmonary effort is normal.  Breath sounds: Normal breath sounds. No wheezing, rhonchi or rales.  Neurological:     Mental Status: She is alert.     Lab Results  Component Value Date   WBC 5.8 01/12/2022   HGB 11.3 01/12/2022   HCT 35.3 01/12/2022   PLT 265 01/12/2022   GLUCOSE 94 10/29/2021   CHOL 202 (H) 10/29/2021   TRIG 68 10/29/2021   HDL 89 10/29/2021   LDLCALC 101 (H) 10/29/2021   ALT 13 10/29/2021   AST 18 10/29/2021   NA 138 10/29/2021   K 4.4 10/29/2021   CL 102 10/29/2021   CREATININE 0.71 10/29/2021   BUN 14 10/29/2021   CO2 20 10/29/2021   TSH 1.900 10/29/2021     Assessment & Plan:   Problem List Items Addressed This Visit       Respiratory   Allergic rhinitis    Trial of Dymista.      Relevant Orders   Ambulatory referral to Allergy   Recurrent sinusitis    Starting on Dymista.  Referring to ENT.      Relevant Medications   Azelastine-Fluticasone 137-50 MCG/ACT SUSP   Other Relevant Orders   Ambulatory referral to Allergy     Other   Anxiety  and depression - Primary    Stable at this time.  PHQ-9 score of 2.  GAD-7 score of 3.  Continue BuSpar.  Continue seeing counselor.       Meds ordered this encounter  Medications   Azelastine-Fluticasone 137-50 MCG/ACT SUSP    Sig: Place 1 spray into the nose 2 (two) times daily.    Dispense:  23 g    Refill:  1    Follow-up:  6 months  [place] DO [place]

## 2022-05-08 ENCOUNTER — Telehealth: Payer: BC Managed Care – PPO | Admitting: Physician Assistant

## 2022-05-08 DIAGNOSIS — B9689 Other specified bacterial agents as the cause of diseases classified elsewhere: Secondary | ICD-10-CM | POA: Diagnosis not present

## 2022-05-08 DIAGNOSIS — J019 Acute sinusitis, unspecified: Secondary | ICD-10-CM | POA: Diagnosis not present

## 2022-05-08 MED ORDER — DOXYCYCLINE HYCLATE 100 MG PO TABS: 100.0000 mg | ORAL_TABLET | Freq: Two times a day (BID) | ORAL | 0 refills | Status: DC

## 2022-05-08 NOTE — Progress Notes (Signed)
E-Visit for Sinus Problems  We are sorry that you are not feeling well.  Here is how we plan to help!  Based on what you have shared with me it looks like you have sinusitis.  Sinusitis is inflammation and infection in the sinus cavities of the head.  Based on your presentation I believe you most likely have Acute Bacterial Sinusitis.  This is an infection caused by bacteria and is treated with antibiotics. I have prescribed Doxycycline 100mg by mouth twice a day for 10 days. You may use an oral decongestant such as Mucinex D or if you have glaucoma or high blood pressure use plain Mucinex. Saline nasal spray help and can safely be used as often as needed for congestion.  If you develop worsening sinus pain, fever or notice severe headache and vision changes, or if symptoms are not better after completion of antibiotic, please schedule an appointment with a health care provider.    Sinus infections are not as easily transmitted as other respiratory infection, however we still recommend that you avoid close contact with loved ones, especially the very young and elderly.  Remember to wash your hands thoroughly throughout the day as this is the number one way to prevent the spread of infection!  Home Care: Only take medications as instructed by your medical team. Complete the entire course of an antibiotic. Do not take these medications with alcohol. A steam or ultrasonic humidifier can help congestion.  You can place a towel over your head and breathe in the steam from hot water coming from a faucet. Avoid close contacts especially the very young and the elderly. Cover your mouth when you cough or sneeze. Always remember to wash your hands.  Get Help Right Away If: You develop worsening fever or sinus pain. You develop a severe head ache or visual changes. Your symptoms persist after you have completed your treatment plan.  Make sure you Understand these instructions. Will watch your  condition. Will get help right away if you are not doing well or get worse.  Thank you for choosing an e-visit.  Your e-visit answers were reviewed by a board certified advanced clinical practitioner to complete your personal care plan. Depending upon the condition, your plan could have included both over the counter or prescription medications.  Please review your pharmacy choice. Make sure the pharmacy is open so you can pick up prescription now. If there is a problem, you may contact your provider through MyChart messaging and have the prescription routed to another pharmacy.  Your safety is important to us. If you have drug allergies check your prescription carefully.   For the next 24 hours you can use MyChart to ask questions about today's visit, request a non-urgent call back, or ask for a work or school excuse. You will get an email in the next two days asking about your experience. I hope that your e-visit has been valuable and will speed your recovery.  I have spent 5 minutes in review of e-visit questionnaire, review and updating patient chart, medical decision making and response to patient.   Frederica M Randell Teare, PA-C  

## 2022-06-23 ENCOUNTER — Ambulatory Visit: Payer: BC Managed Care – PPO | Admitting: Allergy & Immunology

## 2022-06-30 ENCOUNTER — Telehealth: Payer: BC Managed Care – PPO | Admitting: Family Medicine

## 2022-06-30 DIAGNOSIS — J324 Chronic pansinusitis: Secondary | ICD-10-CM

## 2022-06-30 NOTE — Progress Notes (Signed)
[facility]   Because you are having chronic sinus conditions- you have been treated twice in last 6 months- we are feel your condition warrants further evaluation and I recommend that you be seen in a face to face visit.    NOTE: There will be NO CHARGE for this eVisit

## 2022-07-02 ENCOUNTER — Ambulatory Visit: Payer: Self-pay

## 2022-07-03 ENCOUNTER — Ambulatory Visit
Admission: RE | Admit: 2022-07-03 | Discharge: 2022-07-03 | Disposition: A | Discharge status: Home / Self Care | Payer: BC Managed Care – PPO | Source: Ambulatory Visit | Attending: Family Medicine | Admitting: Family Medicine

## 2022-07-03 VITALS — BP 135/77 | HR 86 | Temp 97.8°F | Resp 18

## 2022-07-03 DIAGNOSIS — J0100 Acute maxillary sinusitis, unspecified: Secondary | ICD-10-CM

## 2022-07-03 DIAGNOSIS — B9689 Other specified bacterial agents as the cause of diseases classified elsewhere: Secondary | ICD-10-CM

## 2022-07-03 MED ORDER — DOXYCYCLINE HYCLATE 100 MG PO TABS: 100.0000 mg | ORAL_TABLET | Freq: Two times a day (BID) | ORAL | 0 refills | Status: DC

## 2022-07-03 NOTE — ED Triage Notes (Signed)
Facial pain and pressure, nasal congestion, over 1 week.  Has appointment with allergist in Feb.

## 2022-07-03 NOTE — Discharge Instructions (Signed)
Take a steroid nasal spray such as Flonase or Nasacort twice daily, continue your allergy medication daily, use a sinus rinse with warm saline such as the NeilMed sinus rinse kit several times daily as needed and you may take Sudafed as needed to help with the drainage and pressure.  I have also sent over a course of antibiotics that will hopefully get you feeling better.  Follow-up with your allergist as scheduled.

## 2022-07-03 NOTE — ED Provider Notes (Signed)
[facility]    CSN: [id number] Arrival date & time: 07/03/22  1355      History   Chief Complaint Chief Complaint  Patient presents with   Facial Pain    sinus infection - Entered by patient    HPI Kathy Graham is a 42 y.o. female.   Patient presenting today with about a week of progressively worsening nasal congestion, facial pain and pressure, sinus headache.  Denies cough, chest pain, shortness of breath, fever, chills, body aches.  So far taking Allegra, Rhinocort with no relief.  Has been having frequent sinus infections and scheduled with an allergist for next month.    Past Medical History:  Diagnosis Date   Reflux     Patient Active Problem List   Diagnosis Date Noted   Recurrent sinusitis 05/06/2022   Allergic rhinitis 05/06/2022   Annual physical exam 11/04/2021   Colon cancer screening    GERD (gastroesophageal reflux disease) 04/28/2019   Anxiety and depression 12/09/2012    Past Surgical History:  Procedure Laterality Date   BACK SURGERY     BIOPSY  12/16/2018   Procedure: BIOPSY;  Surgeon: Danie Binder, MD;  Location: [facility];  Service: Endoscopy;;  esophagus gastric    COLONOSCOPY N/A 10/26/2019   Procedure: COLONOSCOPY;  Surgeon: Danie Binder, MD;  Location: [facility];  Service: Endoscopy;  Laterality: N/A;  930am   ESOPHAGOGASTRODUODENOSCOPY N/A 12/16/2018   Dr. Oneida Alar: Patchy mild inflammation characterized by congestion and erythema in the gastric body and gastric antrum.  Web found in the distal esophagus status post dilation.  Gastric biopsies showed gastritis but no H. pylori.  Esophageal biopsy showed reflux.   POLYPECTOMY  10/26/2019   Procedure: POLYPECTOMY;  Surgeon: Danie Binder, MD;  Location: [facility];  Service: Endoscopy;;  descending    OB History   No obstetric history on file.      Home Medications    Prior to Admission medications   Medication Sig Start Date End Date Taking? Authorizing  Provider  Azelastine-Fluticasone 137-50 MCG/ACT SUSP Place 1 spray into the nose 2 (two) times daily. 05/06/22   Coral Spikes, DO  busPIRone (BUSPAR) 10 MG tablet TAKE 1 TABLET BY MOUTH THREE TIMES A DAY 05/04/22   Cook, Jayce G, DO  doxycycline (VIBRA-TABS) 100 MG tablet Take 1 tablet (100 mg total) by mouth 2 (two) times daily. 07/03/22   Volney American, PA-C  drospirenone-ethinyl estradiol Sherrill Raring) 3-0.02 MG tablet Take 1 tablet by mouth daily.    [provider]  fexofenadine (ALLEGRA) 180 MG tablet Take 180 mg by mouth daily.    [provider]  Iron, Ferrous Sulfate, 325 (65 Fe) MG TABS Take 325 mg by mouth every other day. 04/02/22   Coral Spikes, DO  pantoprazole (PROTONIX) 40 MG tablet Take 1 tablet (40 mg total) by mouth 2 (two) times daily. 11/18/21   Sherron Monday, NP  traZODone (DESYREL) 150 MG tablet TAKE 1 TABLET BY MOUTH AT BEDTIME. 05/04/22   Coral Spikes, DO    Family History Family History  Problem Relation Age of Onset   Hyperlipidemia Mother    Colon polyps Mother        before age 69   Hypothyroidism Mother    Hypertension Father    Colon polyps Father        before age 84   Cancer Brother 32       melanoma  Colon cancer Other     Social History Social History   Tobacco Use   Smoking status: Never   Smokeless tobacco: Never  Substance Use Topics   Alcohol use: Yes    Comment: occ glass of wine   Drug use: No     Allergies   Augmentin [amoxicillin-pot clavulanate] and Cefzil [cefprozil]   Review of Systems Review of Systems PER HPI  Physical Exam Triage Vital Signs ED Triage Vitals  Enc Vitals Group     BP 07/03/22 1359 135/77     Pulse Rate 07/03/22 1359 86     Resp 07/03/22 1359 18     Temp 07/03/22 1359 97.8 F (36.6 C)     Temp Source 07/03/22 1359 Oral     SpO2 07/03/22 1359 100 %     Weight --      Height --      Head Circumference --      Peak Flow --      Pain Score 07/03/22 1400 6      Pain Loc --      Pain Edu? --      Excl. in [place]? --    No data found.  Updated Vital Signs BP 135/77 (BP Location: Right Arm)   Pulse 86   Temp 97.8 F (36.6 C) (Oral)   Resp 18   SpO2 100%   Visual Acuity Right Eye Distance:   Left Eye Distance:   Bilateral Distance:    Right Eye Near:   Left Eye Near:    Bilateral Near:     Physical Exam Vitals and nursing note reviewed.  Constitutional:      Appearance: Normal appearance.  HENT:     Head: Atraumatic.     Right Ear: Tympanic membrane and external ear normal.     Left Ear: Tympanic membrane and external ear normal.     Nose: Congestion present.     Mouth/Throat:     Mouth: Mucous membranes are moist.     Pharynx: Posterior oropharyngeal erythema present.  Eyes:     Extraocular Movements: Extraocular movements intact.     Conjunctiva/sclera: Conjunctivae normal.  Cardiovascular:     Rate and Rhythm: Normal rate and regular rhythm.     Heart sounds: Normal heart sounds.  Pulmonary:     Effort: Pulmonary effort is normal.     Breath sounds: Normal breath sounds. No wheezing or rales.  Musculoskeletal:        General: Normal range of motion.     Cervical back: Normal range of motion and neck supple.  Skin:    General: Skin is warm and dry.  Neurological:     Mental Status: She is alert and oriented to person, place, and time.  Psychiatric:        Mood and Affect: Mood normal.        Thought Content: Thought content normal.      UC Treatments / Results  Labs (all labs ordered are listed, but only abnormal results are displayed) Labs Reviewed - No data to display  EKG   Radiology No results found.  Procedures Procedures (including critical care time)  Medications Ordered in UC Medications - No data to display  Initial Impression / Assessment and Plan / UC Course  I have reviewed the triage vital signs and the nursing notes.  Pertinent labs & imaging results that were available during my care  of the patient were reviewed by me and considered in my  medical decision making (see chart for details).     Vital signs benign and reassuring, will treat with doxycycline, continued allergy regimen, nasal rinses.  Return for worsening symptoms.  Final Clinical Impressions(s) / UC Diagnoses   Final diagnoses:  Acute non-recurrent maxillary sinusitis     Discharge Instructions      Take a steroid nasal spray such as Flonase or Nasacort twice daily, continue your allergy medication daily, use a sinus rinse with warm saline such as the NeilMed sinus rinse kit several times daily as needed and you may take Sudafed as needed to help with the drainage and pressure.  I have also sent over a course of antibiotics that will hopefully get you feeling better.  Follow-up with your allergist as scheduled.    ED Prescriptions     Medication Sig Dispense Auth. Provider   doxycycline (VIBRA-TABS) 100 MG tablet Take 1 tablet (100 mg total) by mouth 2 (two) times daily. 14 tablet Volney American, [place]      PDMP not reviewed this encounter.   Volney American, [place] 07/03/22 1421

## 2022-07-30 ENCOUNTER — Ambulatory Visit: Payer: BC Managed Care – PPO | Admitting: Allergy & Immunology

## 2022-07-30 ENCOUNTER — Encounter: Payer: Self-pay | Admitting: Allergy & Immunology

## 2022-07-30 ENCOUNTER — Other Ambulatory Visit: Payer: Self-pay

## 2022-07-30 VITALS — BP 118/70 | HR 78 | Temp 98.1°F | Resp 16 | Ht 63.0 in | Wt 132.6 lb

## 2022-07-30 DIAGNOSIS — J3089 Other allergic rhinitis: Secondary | ICD-10-CM

## 2022-07-30 DIAGNOSIS — K219 Gastro-esophageal reflux disease without esophagitis: Secondary | ICD-10-CM | POA: Diagnosis not present

## 2022-07-30 NOTE — Progress Notes (Signed)
NEW PATIENT  Date of Service/Encounter:  07/30/22  Consult requested by: Coral Spikes, DO   Assessment:   Seasonal and perennial allergic rhinitis - Plan: Allergy Test, Interdermal Allergy Test  Plan/Recommendations:    Patient Instructions  1. Seasonal and perennial allergic rhinitis - Testing today showed: grasses, ragweed, weeds, trees, indoor molds, outdoor molds, dust mites, and cat. - Copy of test results provided.  - Avoidance measures provided. - Stop taking: your current nose spray - Continue with: Allegra (fexofenadine) 173m tablet once daily - Start taking: Singulair (montelukast) 13mdaily and Ryaltris (olopatadine/mometasone) two sprays per nostril 1-2 times daily as needed - Singulair can cause irritability and bad dreams, so beware of that!  - You can use an extra dose of the antihistamine, if needed, for breakthrough symptoms.  - Consider nasal saline rinses 1-2 times daily to remove allergens from the nasal cavities as well as help with mucous clearance (this is especially helpful to do before the nasal sprays are given) - Consider allergy shots as a means of long-term control. - Allergy shots "re-train" and "reset" the immune system to ignore environmental allergens and decrease the resulting immune response to those allergens (sneezing, itchy watery eyes, runny nose, nasal congestion, etc).    - Allergy shots improve symptoms in 75-85% of patients.  - We can discuss more at the next appointment if the medications are not working for you or you can call your insurance to confirm coverage.   2. Return in about 2 months (around 09/28/2022).    Please inform usKoreaf any Emergency Department visits, hospitalizations, or changes in symptoms. Call usKoreaefore going to the ED for breathing or allergy symptoms since we might be able to fit you in for a sick visit. Feel free to contact usKoreanytime with any questions, problems, or concerns.  It was a pleasure to meet you  today!  Websites that have reliable patient information: 1. American Academy of Asthma, Allergy, and Immunology: www.aaaai.org 2. Food Allergy Research and Education (FARE): foodallergy.org 3. Mothers of Asthmatics: http://www.asthmacommunitynetwork.org 4. American College of Allergy, Asthma, and Immunology: www.acaai.org   COVID-19 Vaccine Information can be found at: [email or web address] questions related to vaccine distribution or appointments, please email [email or web address] or call [phone]  We realize that you might be concerned about having an allergic reaction to the COVID19 vaccines. To help with that concern, WE ARE OFFERING THE COVID19 VACCINES IN OUR OFFICE! Ask the front desk for dates!     "Like" usKorean Facebook and Instagram for our latest updates!      A healthy democracy works best when ALNew York Life Insurancearticipate! Make sure you are registered to vote! If you have moved or changed any of your contact information, you will need to get this updated before voting!  In some cases, you MAY be able to register to vote online: [email or web address]     Airborne Adult Perc - 07/30/22 1425     Time Antigen Placed 14LafayetterLavella Hammock  Location Back    Number of Test 59    1. Control-Buffer 50% Glycerol Negative    2. Control-Histamine 1 mg/ml 2+    3. Albumin saline Negative    4. Ba[place]+    5. BeGu[place]+    6. Johnson Negative    7. KeHardinlue Negative    8. Meadow Fescue 2+    9. Perennial Rye Negative    10. Sweet  Vernal Negative    11. Timothy Negative    12. Cocklebur Negative    13. Burweed Marshelder Negative    14. Ragweed, short Negative    15. Ragweed, Giant Negative    16. Plantain,  English Negative    17. Lamb's Quarters 2+    18. Sheep Sorrell Negative    19. Rough Pigweed 2+    20. Marsh Elder, Rough Negative    21. Mugwort,  Common Negative    22. Ash mix Negative    23. Birch mix Negative    24. Beech American Negative    25. Box, Elder Negative    26. Cedar, red 3+    27. Cottonwood, [place] Negative    28. Elm mix Negative    29. Hickory 3+    30. Maple mix Negative    31. Oak, [place] mix 3+    32. Pecan Pollen 3+    33. Pine mix Negative    34. Sycamore Eastern Negative    35. [place], Black Pollen Negative    36. Alternaria alternata Negative    37. Cladosporium Herbarum Negative    38. Aspergillus mix Negative    39. Penicillium mix Negative    40. Bipolaris sorokiniana (Helminthosporium) Negative    41. Drechslera spicifera (Curvularia) Negative    42. Mucor plumbeus Negative    43. Fusarium moniliforme Negative    44. Aureobasidium pullulans (pullulara) Negative    45. Rhizopus oryzae Negative    46. Botrytis cinera Negative    47. Epicoccum nigrum Negative    48. Phoma betae Negative    49. Candida Albicans Negative    50. Trichophyton mentagrophytes Negative    51. Mite, D Farinae  5,000 AU/ml 2+    52. Mite, D Pteronyssinus  5,000 AU/ml Negative    53. Cat Hair 10,000 BAU/ml Negative    54.  Dog Epithelia Negative    55. Mixed Feathers Negative    56. Horse Epithelia Negative    57. Cockroach, German Negative    58. Mouse Negative    59. Tobacco Leaf Negative             Intradermal - 07/30/22 1439     Time Antigen Placed 1445    Allergen Manufacturer Lavella Hammock    Location Arm    Number of Test 9    Intradermal Select    Control Negative    Johnson 2+    Ragweed mix 3+    Mold 1 4+    Mold 2 3+    Mold 3 3+    Mold 4 3+    Cat 3+    Dog Negative    Cockroach Negative             Reducing Pollen Exposure  The American Academy of Allergy, Asthma and Immunology suggests the following steps to reduce your exposure to pollen during allergy seasons.    Do not hang sheets or clothing out to dry; pollen may collect on these items. Do not mow lawns or spend time  around freshly cut grass; mowing stirs up pollen. Keep windows closed at night.  Keep car windows closed while driving. Minimize morning activities outdoors, a time when pollen counts are usually at their highest. Stay indoors as much as possible when pollen counts or humidity is high and on windy days when pollen tends to remain in the air longer. Use air conditioning when possible.  Many air conditioners have filters that trap the pollen  spores. Use a HEPA room air filter to remove pollen form the indoor air you breathe.  Control of Mold Allergen   Mold and fungi can grow on a variety of surfaces provided certain temperature and moisture conditions exist.  Outdoor molds grow on plants, decaying vegetation and soil.  The major outdoor mold, Alternaria and Cladosporium, are found in very high numbers during hot and dry conditions.  Generally, a late Summer - Fall peak is seen for common outdoor fungal spores.  Rain will temporarily lower outdoor mold spore count, but counts rise rapidly when the rainy period ends.  The most important indoor molds are Aspergillus and Penicillium.  Dark, humid and poorly ventilated basements are ideal sites for mold growth.  The next most common sites of mold growth are the bathroom and the kitchen.  Outdoor (Seasonal) Mold Control  Positive outdoor molds via skin testing: Alternaria, Cladosporium, Bipolaris (Helminthsporium), Drechslera (Curvalaria), and Mucor  Use air conditioning and keep windows closed Avoid exposure to decaying vegetation. Avoid leaf raking. Avoid grain handling. Consider wearing a face mask if working in moldy areas.   Indoor (Perennial) Mold Control   Positive indoor molds via skin testing: Aspergillus, Penicillium, Fusarium, Aureobasidium (Pullulara), and Rhizopus  Maintain humidity below 50%. Clean washable surfaces with 5% bleach solution. Remove sources e.g. contaminated carpets.    Control of Dog or Cat Allergen  Avoidance  is the best way to manage a dog or cat allergy. If you have a dog or cat and are allergic to dog or cats, consider removing the dog or cat from the home. If you have a dog or cat but don't want to find it a new home, or if your family wants a pet even though someone in the household is allergic, here are some strategies that may help keep symptoms at bay:  Keep the pet out of your bedroom and restrict it to only a few rooms. Be advised that keeping the dog or cat in only one room will not limit the allergens to that room. Don't pet, hug or kiss the dog or cat; if you do, wash your hands with soap and water. High-efficiency particulate air (HEPA) cleaners run continuously in a bedroom or living room can reduce allergen levels over time. Regular use of a high-efficiency vacuum cleaner or a central vacuum can reduce allergen levels. Giving your dog or cat a bath at least once a week can reduce airborne allergen.  Control of Dust Mite Allergen    Dust mites play a major role in allergic asthma and rhinitis.  They occur in environments with high humidity wherever human skin is found.  Dust mites absorb humidity from the atmosphere (ie, they do not drink) and feed on organic matter (including shed human and animal skin).  Dust mites are a microscopic type of insect that you cannot see with the naked eye.  High levels of dust mites have been detected from mattresses, pillows, carpets, upholstered furniture, bed covers, clothes, soft toys and any woven material.  The principal allergen of the dust mite is found in its feces.  A gram of dust may contain 1,000 mites and 250,000 fecal particles.  Mite antigen is easily measured in the air during house cleaning activities.  Dust mites do not bite and do not cause harm to humans, other than by triggering allergies/asthma.    Ways to decrease your exposure to dust mites in your home:  Encase mattresses, box springs and pillows with a mite-impermeable barrier  or  cover   Wash sheets, blankets and drapes weekly in hot water (130 F) with detergent and dry them in a dryer on the hot setting.  Have the room cleaned frequently with a vacuum cleaner and a damp dust-mop.  For carpeting or rugs, vacuuming with a vacuum cleaner equipped with a high-efficiency particulate air (HEPA) filter.  The dust mite allergic individual should not be in a room which is being cleaned and should wait 1 hour after cleaning before going into the room. Do not sleep on upholstered furniture (eg, couches).   If possible removing carpeting, upholstered furniture and drapery from the home is ideal.  Horizontal blinds should be eliminated in the rooms where the person spends the most time (bedroom, study, television room).  Washable vinyl, roller-type shades are optimal. Remove all non-washable stuffed toys from the bedroom.  Wash stuffed toys weekly like sheets and blankets above.   Reduce indoor humidity to less than 50%.  Inexpensive humidity monitors can be purchased at most hardware stores.  Do not use a humidifier as can make the problem worse and are not recommended.   Allergy Shots  Allergies are the result of a chain reaction that starts in the immune system. Your immune system controls how your body defends itself. For instance, if you have an allergy to pollen, your immune system identifies pollen as an invader or allergen. Your immune system overreacts by producing antibodies called Immunoglobulin E (IgE). These antibodies travel to cells that release chemicals, causing an allergic reaction.  The concept behind allergy immunotherapy, whether it is received in the form of shots or tablets, is that the immune system can be desensitized to specific allergens that trigger allergy symptoms. Although it requires time and patience, the payback can be long-term relief. Allergy injections contain a dilute solution of those substances that you are allergic to based upon your skin testing  and allergy history.   How Do Allergy Shots Work?  Allergy shots work much like a vaccine. Your body responds to injected amounts of a particular allergen given in increasing doses, eventually developing a resistance and tolerance to it. Allergy shots can lead to decreased, minimal or no allergy symptoms.  There generally are two phases: build-up and maintenance. Build-up often ranges from three to six months and involves receiving injections with increasing amounts of the allergens. The shots are typically given once or twice a week, though more rapid build-up schedules are sometimes used.  The maintenance phase begins when the most effective dose is reached. This dose is different for each person, depending on how allergic you are and your response to the build-up injections. Once the maintenance dose is reached, there are longer periods between injections, typically two to four weeks.  Occasionally doctors give cortisone-type shots that can temporarily reduce allergy symptoms. These types of shots are different and should not be confused with allergy immunotherapy shots.  Who Can Be Treated with Allergy Shots?  Allergy shots may be a good treatment approach for people with allergic rhinitis (hay fever), allergic asthma, conjunctivitis (eye allergy) or stinging insect allergy.   Before deciding to begin allergy shots, you should consider:  . The length of allergy season and the severity of your symptoms . Whether medications and/or changes to your environment can control your symptoms . Your desire to avoid long-term medication use . Time: allergy immunotherapy requires a major time commitment . Cost: may vary depending on your insurance coverage  Allergy shots for children age 77  and older are effective and often well tolerated. They might prevent the onset of new allergen sensitivities or the progression to asthma.  Allergy shots are not started on patients who are pregnant but can be  continued on patients who become pregnant while receiving them. In some patients with other medical conditions or who take certain common medications, allergy shots may be of risk. It is important to mention other medications you talk to your allergist.   What are the two types of build-ups offered:   RUSH or Rapid Desensitization -- one day of injections lasting from 8:30-4:30pm, injections every 1 hour.  Approximately half of the build-up process is completed in that one day.  The following week, normal build-up is resumed, and this entails ~16 visits either weekly or twice weekly, until reaching your "maintenance dose" which is continued weekly until eventually getting spaced out to every month for a duration of 3 to 5 years. The regular build-up appointments are nurse visits where the injections are administered, followed by required monitoring for 30 minutes.    Traditional build-up -- weekly visits for 6 -12 months until reaching "maintenance dose", then continue weekly until eventually spacing out to every 4 weeks as above. At these appointments, the injections are administered, followed by required monitoring for 30 minutes.     Either way is acceptable, and both are equally effective. With the rush protocol, the advantage is that less time is spent here for injections overall AND you would also reach maintenance dosing faster (which is when the clinical benefit starts to become apparent).  Not everyone is a candidate for rapid desensitization.   IF we proceed with the RUSH protocol, there are premedications which must be taken the day before and the day after the rush only (this includes antihistamines, steroids, and Singulair).  After the rush day, no prednisone or Singulair is required, and we just recommend antihistamines taken on your injection day.  What Is An Estimate of the Costs?  If you are interested in starting allergy injections, please check with your insurance company about  your coverage for both allergy vial sets and allergy injections.  Please do so prior to making the appointment to start injections.  The following are CPT codes to give to your insurance company. These are the amounts we bill to [employer], but the amount you will pay and we receive is substantially less, depending on the contracts we have with different insurance companies.   Amount Billed to Insurance One allergy vial set  CPT 95165   $ 1200     Two allergy vial set  CPT 95165   $ 2400     Three allergy vial set  CPT 95165   $ 3600     One injection   CPT 95115   $ 35  Two injections   CPT 95117   $ 40 RUSH (Rapid Desensitization) CPT 95180 x 8 hours $500/hour  Regarding the allergy injections, your co-pay may or may not apply with each injection, so please confirm this with your insurance company. When you start allergy injections, 1 or 2 sets of vials are made based on your allergies.  Not all patients can be on one set of vials. A set of vials lasts 6 months to a year depending on how quickly you can proceed with your build-up of your allergy injections. Vials are personalized for each patient depending on their specific allergens.  How often are allergy injection given during the build-up  period?   Injections are given at least weekly during the build-up period until your maintenance dose is achieved. Per the doctor's discretion, you may have the option of getting allergy injections two times per week during the build-up period. However, there must be at least 48 hours between injections. The build-up period is usually completed within 6-12 months depending on your ability to schedule injections and for adjustments for reactions. When maintenance dose is reached, your injection schedule is gradually changed to every two weeks and later to every three weeks. Injections will then continue every 4 weeks. Usually, injections are continued for a total of 3-5 years.   When Will I Feel  Better?  Some may experience decreased allergy symptoms during the build-up phase. For others, it may take as long as 12 months on the maintenance dose. If there is no improvement after a year of maintenance, your allergist will discuss other treatment options with you.  If you aren't responding to allergy shots, it may be because there is not enough dose of the allergen in your vaccine or there are missing allergens that were not identified during your allergy testing. Other reasons could be that there are high levels of the allergen in your environment or major exposure to non-allergic triggers like tobacco smoke.  What Is the Length of Treatment?  Once the maintenance dose is reached, allergy shots are generally continued for three to five years. The decision to stop should be discussed with your allergist at that time. Some people may experience a permanent reduction of allergy symptoms. Others may relapse and a longer course of allergy shots can be considered.  What Are the Possible Reactions?  The two types of adverse reactions that can occur with allergy shots are local and systemic. Common local reactions include very mild redness and swelling at the injection site, which can happen immediately or several hours after. Report a delayed reaction from your last injection. These include arm swelling or runny nose, watery eyes or cough that occurs within 12-24 hours after injection. A systemic reaction, which is less common, affects the entire body or a particular body system. They are usually mild and typically respond quickly to medications. Signs include increased allergy symptoms such as sneezing, a stuffy nose or hives.   Rarely, a serious systemic reaction called anaphylaxis can develop. Symptoms include swelling in the throat, wheezing, a feeling of tightness in the chest, nausea or dizziness. Most serious systemic reactions develop within 30 minutes of allergy shots. This is why it is  strongly recommended you wait in your doctor's office for 30 minutes after your injections. Your allergist is trained to watch for reactions, and his or her staff is trained and equipped with the proper medications to identify and treat them.   Report to the nurse immediately if you experience any of the following symptoms: swelling, itching or redness of the skin, hives, watery eyes/nose, breathing difficulty, excessive sneezing, coughing, stomach pain, diarrhea, or light headedness. These symptoms may occur within 15-20 minutes after injection and may require medication.   Who Should Administer Allergy Shots?  The preferred location for receiving shots is your prescribing allergist's office. Injections can sometimes be given at another facility where the physician and staff are trained to recognize and treat reactions, and have received instructions by your prescribing allergist.  What if I am late for an injection?   Injection dose will be adjusted depending upon how many days or weeks you are late for your injection.  What if I am sick?   Please report any illness to the nurse before receiving injections. She may adjust your dose or postpone injections depending on your symptoms. If you have fever, flu, sinus infection or chest congestion it is best to postpone allergy injections until you are better. Never get an allergy injection if your asthma is causing you problems. If your symptoms persist, seek out medical care to get your health problem under control.  What If I am or Become Pregnant:  Women that become pregnant should schedule an appointment with [facility] before receiving any further allergy injections.          This note in its entirety was forwarded to the Provider who requested this consultation.  Subjective:   Kathy Graham is a 42 y.o. female presenting today for evaluation of  Chief Complaint  Patient presents with  . Sinus Problem  .  Allergic Rhinitis   . Gastroesophageal Reflux    Kathy Graham has a history of the following: Patient Active Problem List   Diagnosis Date Noted  . Recurrent sinusitis 05/06/2022  . Allergic rhinitis 05/06/2022  . Annual physical exam 11/04/2021  . Colon cancer screening   . GERD (gastroesophageal reflux disease) 04/28/2019  . Anxiety and depression 12/09/2012    History obtained from: chart review and {Persons; PED relatives w/patient:19415::"patient"}.  [name] was referred by Ronald Pippins     Greicy is a 42 y.o. female presenting for {Blank single:19197::"a food challenge","a drug challenge","skin testing","a sick visit","an evaluation of ***","a follow up visit"}.    Allergic Rhinitis Symptom History: She has typically had 1-2 sinus infections per year. Since May 2022, she had sinus infections every 6-8 weeks. It was a bit better in 2023 but it is still frequent. She did see ENT in September 2022.  She saw Dr. Lucia Gaskins and everything was good. She never had any sinus surgery. It was recommended that she "deal with antibiotics every once in a while". This is always sinus infections and does not include other infections. She was tested around age 64. She was on shots from 8 through 51. This was in this area. She has a lot of symptoms when she runs and she does not premedicate before running. She is running 3 times per week. But then the sinus infections mess up her schedule.  She is now on Allegra. She is on Nasacort. She has been on Flonase in distant past. She has been on Zyrtec and Claritin for a long time.   Food Allergy Symptom History: She did have food sensitivity testing and did an elimination diet. This is stable now.   GERD Symptom History: She has severe GERD.  She is on Protonix now.   ***Otherwise, there is no history of other atopic diseases, including {Blank multiple:19196:o:"asthma","food allergies","drug allergies","environmental  allergies","stinging insect allergies","eczema","urticaria","contact dermatitis"}. There is no significant infectious history. ***Vaccinations are up to date.    Past Medical History: Patient Active Problem List   Diagnosis Date Noted  . Recurrent sinusitis 05/06/2022  . Allergic rhinitis 05/06/2022  . Annual physical exam 11/04/2021  . Colon cancer screening   . GERD (gastroesophageal reflux disease) 04/28/2019  . Anxiety and depression 12/09/2012    Medication List:  Allergies as of 07/30/2022       Reactions   Augmentin [amoxicillin-pot Clavulanate] Nausea And Vomiting   Cefzil [cefprozil]    Serum sickness        Medication List  Accurate as of July 30, 2022 11:59 PM. If you have any questions, ask your nurse or doctor.          Azelastine-Fluticasone 137-50 MCG/ACT Susp Place 1 spray into the nose 2 (two) times daily.   busPIRone 10 MG tablet Commonly known as: BUSPAR TAKE 1 TABLET BY MOUTH THREE TIMES A DAY   doxycycline 100 MG tablet Commonly known as: VIBRA-TABS Take 1 tablet (100 mg total) by mouth 2 (two) times daily.   drospirenone-ethinyl estradiol 3-0.02 MG tablet Commonly known as: YAZ Take 1 tablet by mouth daily.   fexofenadine 180 MG tablet Commonly known as: ALLEGRA Take 180 mg by mouth daily.   Iron (Ferrous Sulfate) 325 (65 Fe) MG Tabs Take 325 mg by mouth every other day.   pantoprazole 40 MG tablet Commonly known as: PROTONIX Take 1 tablet (40 mg total) by mouth 2 (two) times daily.   traZODone 150 MG tablet Commonly known as: DESYREL TAKE 1 TABLET BY MOUTH AT BEDTIME.        Birth History: {Blank single:19197::"non-contributory","born premature and spent time in the NICU","born at term without complications"}  Developmental History: Thekla has met all milestones on time. She has required no {Blank multiple:19196:a:"speech therapy","occupational therapy","physical therapy"}. ***non-contributory  Past Surgical  History: Past Surgical History:  Procedure Laterality Date  . BACK SURGERY    . BIOPSY  12/16/2018   Procedure: BIOPSY;  Surgeon: Danie Binder, MD;  [facility];  Service: Endoscopy;;  esophagus gastric   . COLONOSCOPY N/A 10/26/2019   Procedure: COLONOSCOPY;  Surgeon: Danie Binder, MD;  [facility]: [facility];  Service: Endoscopy;  Laterality: N/A;  930am  . ESOPHAGOGASTRODUODENOSCOPY N/A 12/16/2018   Dr. Oneida Alar: Patchy mild inflammation characterized by congestion and erythema in the gastric body and gastric antrum.  Web found in the distal esophagus status post dilation.  Gastric biopsies showed gastritis but no H. pylori.  Esophageal biopsy showed reflux.  Marland Kitchen POLYPECTOMY  10/26/2019   Procedure: POLYPECTOMY;  Surgeon: Danie Binder, MD;  Location: [facility];  Service: Endoscopy;;  descending     Family History: Family History  Problem Relation Age of Onset  . Allergic rhinitis Mother   . Hyperlipidemia Mother   . Colon polyps Mother        before age 32  . Hypothyroidism Mother   . Hypertension Father   . Colon polyps Father        before age 21  . Allergic rhinitis Brother   . Cancer Brother 72       melanoma  . Colon cancer Other      Social History: Alexus lives at home with her [place]. 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 She previously did something with [employer].  They live in a house that is 44 years old.  There is wood and carpeting in the home.  They have a heat pump and gas heating.  There are no animals inside or outside of the home.  There are dust mite covers on the bed as well as the pillows.  There is no tobacco exposure.  There is no fume, chemical, or dust exposure.  She does not live near an interstate or industrial area.   ROS     Objective:   Blood pressure 118/70, pulse 78, temperature 98.1 F (36.7 C), temperature source Temporal, resp. rate 16, height 5' 3"$  (1.6 m), weight 132 lb 9.6  oz (60.1 kg), SpO2 100 %.  Body mass index is 23.49 kg/m.     Physical Exam   Diagnostic studies:   Allergy Studies:     Airborne Adult Perc - 07/30/22 1425     Time Antigen Placed 1425    Allergen Manufacturer Lavella Hammock    Location Back    Number of Test 59    1. Control-Buffer 50% Glycerol Negative    2. Control-Histamine 1 mg/ml 2+    3. Albumin saline Negative    4. [place] 2+    5. [place] 2+    6. Johnson Negative    7. [place] Blue Negative    8. Meadow Fescue 2+    9. Perennial Rye Negative    10. Sweet Vernal Negative    11. Timothy Negative    12. Cocklebur Negative    13. Burweed Marshelder Negative    14. Ragweed, short Negative    15. Ragweed, Giant Negative    16. Plantain,  English Negative    17. Lamb's Quarters 2+    18. Sheep Sorrell Negative    19. Rough Pigweed 2+    20. Marsh Elder, Rough Negative    21. Mugwort, Common Negative    22. Ash mix Negative    23. Birch mix Negative    24. Beech American Negative    25. Box, Elder Negative    26. Cedar, red 3+    27. Cottonwood, [place] Negative    28. Elm mix Negative    29. Hickory 3+    30. Maple mix Negative    31. Oak, [place] mix 3+    32. Pecan Pollen 3+    33. Pine mix Negative    34. Sycamore Eastern Negative    35. Harris, Black Pollen Negative    36. Alternaria alternata Negative    37. Cladosporium Herbarum Negative    38. Aspergillus mix Negative    39. Penicillium mix Negative    40. Bipolaris sorokiniana (Helminthosporium) Negative    41. Drechslera spicifera (Curvularia) Negative    42. Mucor plumbeus Negative    43. Fusarium moniliforme Negative    44. Aureobasidium pullulans (pullulara) Negative    45. Rhizopus oryzae Negative    46. Botrytis cinera Negative    47. Epicoccum nigrum Negative    48. Phoma betae Negative    49. Candida Albicans Negative    50. Trichophyton mentagrophytes Negative    51. Mite, D Farinae  5,000 AU/ml 2+    52. Mite, D Pteronyssinus  5,000  AU/ml Negative    53. Cat Hair 10,000 BAU/ml Negative    54.  Dog Epithelia Negative    55. Mixed Feathers Negative    56. Horse Epithelia Negative    57. Cockroach, German Negative    58. Mouse Negative    59. Tobacco Leaf Negative             Intradermal - 07/30/22 1439     Time Antigen Placed 1445    Allergen Manufacturer Lavella Hammock    Location Arm    Number of Test 9    Intradermal Select    Control Negative    Johnson 2+    Ragweed mix 3+    Mold 1 4+    Mold 2 3+    Mold 3 3+    Mold 4 3+    Cat 3+    Dog Negative    Cockroach Negative             Allergy testing results  were read and interpreted by myself, documented by clinical staff.         Salvatore Marvel, MD [facility]

## 2022-07-30 NOTE — Patient Instructions (Addendum)
1. Seasonal and perennial allergic rhinitis - Testing today showed: grasses, ragweed, weeds, trees, indoor molds, outdoor molds, dust mites, and cat. - Copy of test results provided.  - Avoidance measures provided. - Stop taking: your current nose spray - Continue with: Allegra (fexofenadine) '180mg'$  tablet once daily - Start taking: Singulair (montelukast) '10mg'$  daily and Ryaltris (olopatadine/mometasone) two sprays per nostril 1-2 times daily as needed - Singulair can cause irritability and bad dreams, so beware of that!  - You can use an extra dose of the antihistamine, if needed, for breakthrough symptoms.  - Consider nasal saline rinses 1-2 times daily to remove allergens from the nasal cavities as well as help with mucous clearance (this is especially helpful to do before the nasal sprays are given) - Consider allergy shots as a means of long-term control. - Allergy shots "re-train" and "reset" the immune system to ignore environmental allergens and decrease the resulting immune response to those allergens (sneezing, itchy watery eyes, runny nose, nasal congestion, etc).    - Allergy shots improve symptoms in 75-85% of patients.  - We can discuss more at the next appointment if the medications are not working for you or you can call your insurance to confirm coverage.   2. Return in about 2 months (around 09/28/2022).    Please inform us of any Emergency Department visits, hospitalizations, or changes in symptoms. Call us before going to the ED for breathing or allergy symptoms since we might be able to fit you in for a sick visit. Feel free to contact us anytime with any questions, problems, or concerns.  It was a pleasure to meet you today!  Websites that have reliable patient information: 1. American Academy of Asthma, Allergy, and Immunology: www.aaaai.org 2. Food Allergy Research and Education (FARE): foodallergy.org 3. Mothers of Asthmatics: http://www.asthmacommunitynetwork.org 4.  American College of Allergy, Asthma, and Immunology: www.acaai.org   COVID-19 Vaccine Information can be found at: [email or web address] For questions related to vaccine distribution or appointments, please email [email or web address] or call [phone].   We realize that you might be concerned about having an allergic reaction to the COVID19 vaccines. To help with that concern, WE ARE OFFERING THE COVID19 VACCINES IN OUR OFFICE! Ask the front desk for dates!     "Like" [place] on Facebook and Instagram for our latest updates!      A healthy democracy works best when [employer] participate! Make sure you are registered to vote! If you have moved or changed any of your contact information, you will need to get this updated before voting!  In some cases, you MAY be able to register to vote online: [email or web address]       Airborne Adult Perc - 07/30/22 1425     Time Antigen Placed [place] Lavella Hammock    Location Back    Number of Test 59    1. Control-Buffer 50% Glycerol Negative    2. Control-Histamine 1 mg/ml 2+    3. Albumin saline Negative    4. [place] 2+    5. [place] 2+    6. Johnson Negative    7. [place] Blue Negative    8. Meadow Fescue 2+    9. Perennial Rye Negative    10. Sweet Vernal Negative    11. Timothy Negative    12. Cocklebur Negative    13. Burweed Marshelder Negative    14. Ragweed, short Negative    15. Ragweed, Giant Negative    16. Plantain,  English  Negative    17. Lamb's Quarters 2+    18. Sheep Sorrell Negative    19. Rough Pigweed 2+    20. Marsh Elder, Rough Negative    21. Mugwort, Common Negative    22. Ash mix Negative    23. Birch mix Negative    24. Beech American Negative    25. Box, Elder Negative    26. Cedar, red 3+    27. Cottonwood, [place] Negative    28. Elm mix Negative    29. Hickory 3+    30. Maple mix Negative    31.  Oak, [place] mix 3+    32. Pecan Pollen 3+    33. Pine mix Negative    34. Sycamore Eastern Negative    35. Murdock, Black Pollen Negative    36. Alternaria alternata Negative    37. Cladosporium Herbarum Negative    38. Aspergillus mix Negative    39. Penicillium mix Negative    40. Bipolaris sorokiniana (Helminthosporium) Negative    41. Drechslera spicifera (Curvularia) Negative    42. Mucor plumbeus Negative    43. Fusarium moniliforme Negative    44. Aureobasidium pullulans (pullulara) Negative    45. Rhizopus oryzae Negative    46. Botrytis cinera Negative    47. Epicoccum nigrum Negative    48. Phoma betae Negative    49. Candida Albicans Negative    50. Trichophyton mentagrophytes Negative    51. Mite, D Farinae  5,000 AU/ml 2+    52. Mite, D Pteronyssinus  5,000 AU/ml Negative    53. Cat Hair 10,000 BAU/ml Negative    54.  Dog Epithelia Negative    55. Mixed Feathers Negative    56. Horse Epithelia Negative    57. Cockroach, German Negative    58. Mouse Negative    59. Tobacco Leaf Negative             Intradermal - 07/30/22 1439     Time Antigen Placed 1445    Allergen Manufacturer Lavella Hammock    Location Arm    Number of Test 9    Intradermal Select    Control Negative    Johnson 2+    Ragweed mix 3+    Mold 1 4+    Mold 2 3+    Mold 3 3+    Mold 4 3+    Cat 3+    Dog Negative    Cockroach Negative             Reducing Pollen Exposure  The American Academy of Allergy, Asthma and Immunology suggests the following steps to reduce your exposure to pollen during allergy seasons.    Do not hang sheets or clothing out to dry; pollen may collect on these items. Do not mow lawns or spend time around freshly cut grass; mowing stirs up pollen. Keep windows closed at night.  Keep car windows closed while driving. Minimize morning activities outdoors, a time when pollen counts are usually at their highest. Stay indoors as much as possible when pollen counts  or humidity is high and on windy days when pollen tends to remain in the air longer. Use air conditioning when possible.  Many air conditioners have filters that trap the pollen spores. Use a HEPA room air filter to remove pollen form the indoor air you breathe.  Control of Mold Allergen   Mold and fungi can grow on a variety of surfaces provided certain temperature and moisture conditions exist.  Outdoor  molds grow on plants, decaying vegetation and soil.  The major outdoor mold, Alternaria and Cladosporium, are found in very high numbers during hot and dry conditions.  Generally, a late Summer - Fall peak is seen for common outdoor fungal spores.  Rain will temporarily lower outdoor mold spore count, but counts rise rapidly when the rainy period ends.  The most important indoor molds are Aspergillus and Penicillium.  Dark, humid and poorly ventilated basements are ideal sites for mold growth.  The next most common sites of mold growth are the bathroom and the kitchen.  Outdoor (Seasonal) Mold Control  Positive outdoor molds via skin testing: Alternaria, Cladosporium, Bipolaris (Helminthsporium), Drechslera (Curvalaria), and Mucor  Use air conditioning and keep windows closed Avoid exposure to decaying vegetation. Avoid leaf raking. Avoid grain handling. Consider wearing a face mask if working in moldy areas.   Indoor (Perennial) Mold Control   Positive indoor molds via skin testing: Aspergillus, Penicillium, Fusarium, Aureobasidium (Pullulara), and Rhizopus  Maintain humidity below 50%. Clean washable surfaces with 5% bleach solution. Remove sources e.g. contaminated carpets.    Control of Dog or Cat Allergen  Avoidance is the best way to manage a dog or cat allergy. If you have a dog or cat and are allergic to dog or cats, consider removing the dog or cat from the home. If you have a dog or cat but don't want to find it a new home, or if your family wants a pet even though someone  in the household is allergic, here are some strategies that may help keep symptoms at bay:  Keep the pet out of your bedroom and restrict it to only a few rooms. Be advised that keeping the dog or cat in only one room will not limit the allergens to that room. Don't pet, hug or kiss the dog or cat; if you do, wash your hands with soap and water. High-efficiency particulate air (HEPA) cleaners run continuously in a bedroom or living room can reduce allergen levels over time. Regular use of a high-efficiency vacuum cleaner or a central vacuum can reduce allergen levels. Giving your dog or cat a bath at least once a week can reduce airborne allergen.  Control of Dust Mite Allergen    Dust mites play a major role in allergic asthma and rhinitis.  They occur in environments with high humidity wherever human skin is found.  Dust mites absorb humidity from the atmosphere (ie, they do not drink) and feed on organic matter (including shed human and animal skin).  Dust mites are a microscopic type of insect that you cannot see with the naked eye.  High levels of dust mites have been detected from mattresses, pillows, carpets, upholstered furniture, bed covers, clothes, soft toys and any woven material.  The principal allergen of the dust mite is found in its feces.  A gram of dust may contain 1,000 mites and 250,000 fecal particles.  Mite antigen is easily measured in the air during house cleaning activities.  Dust mites do not bite and do not cause harm to humans, other than by triggering allergies/asthma.    Ways to decrease your exposure to dust mites in your home:  Encase mattresses, box springs and pillows with a mite-impermeable barrier or cover   Wash sheets, blankets and drapes weekly in hot water (130 F) with detergent and dry them in a dryer on the hot setting.  Have the room cleaned frequently with a vacuum cleaner and a damp dust-mop.  For carpeting or rugs, vacuuming with a vacuum cleaner  equipped with a high-efficiency particulate air (HEPA) filter.  The dust mite allergic individual should not be in a room which is being cleaned and should wait 1 hour after cleaning before going into the room. Do not sleep on upholstered furniture (eg, couches).   If possible removing carpeting, upholstered furniture and drapery from the home is ideal.  Horizontal blinds should be eliminated in the rooms where the person spends the most time (bedroom, study, television room).  Washable vinyl, roller-type shades are optimal. Remove all non-washable stuffed toys from the bedroom.  Wash stuffed toys weekly like sheets and blankets above.   Reduce indoor humidity to less than 50%.  Inexpensive humidity monitors can be purchased at most hardware stores.  Do not use a humidifier as can make the problem worse and are not recommended.   Allergy Shots  Allergies are the result of a chain reaction that starts in the immune system. Your immune system controls how your body defends itself. For instance, if you have an allergy to pollen, your immune system identifies pollen as an invader or allergen. Your immune system overreacts by producing antibodies called Immunoglobulin E (IgE). These antibodies travel to cells that release chemicals, causing an allergic reaction.  The concept behind allergy immunotherapy, whether it is received in the form of shots or tablets, is that the immune system can be desensitized to specific allergens that trigger allergy symptoms. Although it requires time and patience, the payback can be long-term relief. Allergy injections contain a dilute solution of those substances that you are allergic to based upon your skin testing and allergy history.   How Do Allergy Shots Work?  Allergy shots work much like a vaccine. Your body responds to injected amounts of a particular allergen given in increasing doses, eventually developing a resistance and tolerance to it. Allergy shots can lead  to decreased, minimal or no allergy symptoms.  There generally are two phases: build-up and maintenance. Build-up often ranges from three to six months and involves receiving injections with increasing amounts of the allergens. The shots are typically given once or twice a week, though more rapid build-up schedules are sometimes used.  The maintenance phase begins when the most effective dose is reached. This dose is different for each person, depending on how allergic you are and your response to the build-up injections. Once the maintenance dose is reached, there are longer periods between injections, typically two to four weeks.  Occasionally doctors give cortisone-type shots that can temporarily reduce allergy symptoms. These types of shots are different and should not be confused with allergy immunotherapy shots.  Who Can Be Treated with Allergy Shots?  Allergy shots may be a good treatment approach for people with allergic rhinitis (hay fever), allergic asthma, conjunctivitis (eye allergy) or stinging insect allergy.   Before deciding to begin allergy shots, you should consider:   The length of allergy season and the severity of your symptoms  Whether medications and/or changes to your environment can control your symptoms  Your desire to avoid long-term medication use  Time: allergy immunotherapy requires a major time commitment  Cost: may vary depending on your insurance coverage  Allergy shots for children age 65 and older are effective and often well tolerated. They might prevent the onset of new allergen sensitivities or the progression to asthma.  Allergy shots are not started on patients who are pregnant but can be continued on patients who become pregnant  while receiving them. In some patients with other medical conditions or who take certain common medications, allergy shots may be of risk. It is important to mention other medications you talk to your allergist.   What are  the two types of build-ups offered:   RUSH or Rapid Desensitization -- one day of injections lasting from 8:30-4:30pm, injections every 1 hour.  Approximately half of the build-up process is completed in that one day.  The following week, normal build-up is resumed, and this entails ~16 visits either weekly or twice weekly, until reaching your "maintenance dose" which is continued weekly until eventually getting spaced out to every month for a duration of 3 to 5 years. The regular build-up appointments are nurse visits where the injections are administered, followed by required monitoring for 30 minutes.    Traditional build-up -- weekly visits for 6 -12 months until reaching "maintenance dose", then continue weekly until eventually spacing out to every 4 weeks as above. At these appointments, the injections are administered, followed by required monitoring for 30 minutes.     Either way is acceptable, and both are equally effective. With the rush protocol, the advantage is that less time is spent here for injections overall AND you would also reach maintenance dosing faster (which is when the clinical benefit starts to become apparent).  Not everyone is a candidate for rapid desensitization.   IF we proceed with the RUSH protocol, there are premedications which must be taken the day before and the day after the rush only (this includes antihistamines, steroids, and Singulair).  After the rush day, no prednisone or Singulair is required, and we just recommend antihistamines taken on your injection day.  What Is An Estimate of the Costs?  If you are interested in starting allergy injections, please check with your insurance company about your coverage for both allergy vial sets and allergy injections.  Please do so prior to making the appointment to start injections.  The following are CPT codes to give to your insurance company. These are the amounts we bill to [employer], but the amount  you will pay and we receive is substantially less, depending on the contracts we have with different insurance companies.   Amount Billed to Insurance One allergy vial set  CPT 95165   $ 1200     Two allergy vial set  CPT 95165   $ 2400     Three allergy vial set  CPT 95165   $ 3600     One injection   CPT 95115   $ 35  Two injections   CPT 95117   $ 40 RUSH (Rapid Desensitization) CPT 95180 x 8 hours $500/hour  Regarding the allergy injections, your co-pay may or may not apply with each injection, so please confirm this with your insurance company. When you start allergy injections, 1 or 2 sets of vials are made based on your allergies.  Not all patients can be on one set of vials. A set of vials lasts 6 months to a year depending on how quickly you can proceed with your build-up of your allergy injections. Vials are personalized for each patient depending on their specific allergens.  How often are allergy injection given during the build-up period?   Injections are given at least weekly during the build-up period until your maintenance dose is achieved. Per the doctor's discretion, you may have the option of getting allergy injections two times per week during the build-up period. However, there  must be at least 48 hours between injections. The build-up period is usually completed within 6-12 months depending on your ability to schedule injections and for adjustments for reactions. When maintenance dose is reached, your injection schedule is gradually changed to every two weeks and later to every three weeks. Injections will then continue every 4 weeks. Usually, injections are continued for a total of 3-5 years.   When Will I Feel Better?  Some may experience decreased allergy symptoms during the build-up phase. For others, it may take as long as 12 months on the maintenance dose. If there is no improvement after a year of maintenance, your allergist will discuss other treatment options with  you.  If you aren't responding to allergy shots, it may be because there is not enough dose of the allergen in your vaccine or there are missing allergens that were not identified during your allergy testing. Other reasons could be that there are high levels of the allergen in your environment or major exposure to non-allergic triggers like tobacco smoke.  What Is the Length of Treatment?  Once the maintenance dose is reached, allergy shots are generally continued for three to five years. The decision to stop should be discussed with your allergist at that time. Some people may experience a permanent reduction of allergy symptoms. Others may relapse and a longer course of allergy shots can be considered.  What Are the Possible Reactions?  The two types of adverse reactions that can occur with allergy shots are local and systemic. Common local reactions include very mild redness and swelling at the injection site, which can happen immediately or several hours after. Report a delayed reaction from your last injection. These include arm swelling or runny nose, watery eyes or cough that occurs within 12-24 hours after injection. A systemic reaction, which is less common, affects the entire body or a particular body system. They are usually mild and typically respond quickly to medications. Signs include increased allergy symptoms such as sneezing, a stuffy nose or hives.   Rarely, a serious systemic reaction called anaphylaxis can develop. Symptoms include swelling in the throat, wheezing, a feeling of tightness in the chest, nausea or dizziness. Most serious systemic reactions develop within 30 minutes of allergy shots. This is why it is strongly recommended you wait in your doctor's office for 30 minutes after your injections. Your allergist is trained to watch for reactions, and his or her staff is trained and equipped with the proper medications to identify and treat them.   Report to the nurse  immediately if you experience any of the following symptoms: swelling, itching or redness of the skin, hives, watery eyes/nose, breathing difficulty, excessive sneezing, coughing, stomach pain, diarrhea, or light headedness. These symptoms may occur within 15-20 minutes after injection and may require medication.   Who Should Administer Allergy Shots?  The preferred location for receiving shots is your prescribing allergist's office. Injections can sometimes be given at another facility where the physician and staff are trained to recognize and treat reactions, and have received instructions by your prescribing allergist.  What if I am late for an injection?   Injection dose will be adjusted depending upon how many days or weeks you are late for your injection.   What if I am sick?   Please report any illness to the nurse before receiving injections. She may adjust your dose or postpone injections depending on your symptoms. If you have fever, flu, sinus infection or chest congestion it  is best to postpone allergy injections until you are better. Never get an allergy injection if your asthma is causing you problems. If your symptoms persist, seek out medical care to get your health problem under control.  What If I am or Become Pregnant:  Women that become pregnant should schedule an appointment with [facility] [place] before receiving any further allergy injections.

## 2022-08-13 ENCOUNTER — Telehealth: Payer: Self-pay | Admitting: Allergy & Immunology

## 2022-08-13 ENCOUNTER — Other Ambulatory Visit: Payer: Self-pay | Admitting: *Deleted

## 2022-08-13 MED ORDER — MONTELUKAST SODIUM 10 MG PO TABS: 10.0000 mg | ORAL_TABLET | Freq: Every day | ORAL | 1 refills | Status: DC

## 2022-08-13 MED ORDER — MONTELUKAST SODIUM 10 MG PO TABS: 10.0000 mg | ORAL_TABLET | Freq: Every day | ORAL | 5 refills | Status: DC

## 2022-08-13 MED ORDER — RYALTRIS 665-25 MCG/ACT NA SUSP: NASAL | 2 refills | Status: DC

## 2022-08-13 NOTE — Telephone Encounter (Signed)
Called patient - DOB verified  - advised medication refills on Ryaltris and Singulair will be sent to [facility] now.  Patient verbalized understanding, no further questions.

## 2022-08-13 NOTE — Telephone Encounter (Signed)
Patient called and said that her rx was not called into [facility] . Ryaltis, singulair.  [facility] [phone]

## 2022-08-24 NOTE — Telephone Encounter (Signed)
Pt states pharmacy needs more info for rx sent- ryaltris

## 2022-08-25 ENCOUNTER — Other Ambulatory Visit: Payer: Self-pay | Admitting: Family Medicine

## 2022-08-25 ENCOUNTER — Encounter: Payer: Self-pay | Admitting: Family Medicine

## 2022-08-25 MED ORDER — TRAZODONE HCL 50 MG PO TABS: ORAL_TABLET | ORAL | 0 refills | Status: DC

## 2022-08-31 ENCOUNTER — Telehealth: Payer: Self-pay | Admitting: Allergy & Immunology

## 2022-08-31 MED ORDER — RYALTRIS 665-25 MCG/ACT NA SUSP: NASAL | 5 refills | Status: DC

## 2022-08-31 NOTE — Telephone Encounter (Signed)
I called patient and informed Ryaltris is sent through a specialty pharmacy [facility]. I provided the phone number for [facility] to the patient if they don't reach out to her. I also resent the prescription since the initial one was sent to [place]. Patient stated she is almost done with the sample that she received at her last visit. I informed patient I would see if there are any in [place]. Patient stated she could come to either the [place] or [place] office. I informed I would check to see if there was any and callback.   Can anyone from [place] check and see if there are any Ryaltris samples in stock. Thanks.

## 2022-08-31 NOTE — Telephone Encounter (Signed)
Pt states she needs a call back concerning ryaltris, [facility] has not received the fax yet.

## 2022-09-01 NOTE — Telephone Encounter (Signed)
Sample for Ryaltris has been placed in [address] for pickup. I called patient and informed.

## 2022-09-02 ENCOUNTER — Telehealth: Payer: Self-pay

## 2022-09-02 NOTE — Telephone Encounter (Signed)
PA has been APPROVED from 09/02/2022-09/01/2023

## 2022-09-02 NOTE — Telephone Encounter (Signed)
PA request received via CMM for Ryaltris [id number] suspension  PA has been submitted to [place] and is pending determination.  Key: [id number]

## 2022-09-29 ENCOUNTER — Encounter: Payer: Self-pay | Admitting: Allergy & Immunology

## 2022-09-29 ENCOUNTER — Other Ambulatory Visit: Payer: Self-pay

## 2022-09-29 ENCOUNTER — Ambulatory Visit: Payer: BC Managed Care – PPO | Admitting: Allergy & Immunology

## 2022-09-29 VITALS — BP 104/60 | HR 62 | Temp 97.9°F | Resp 16 | Wt 133.8 lb

## 2022-09-29 DIAGNOSIS — J3089 Other allergic rhinitis: Secondary | ICD-10-CM

## 2022-09-29 DIAGNOSIS — K219 Gastro-esophageal reflux disease without esophagitis: Secondary | ICD-10-CM | POA: Diagnosis not present

## 2022-09-29 DIAGNOSIS — J302 Other seasonal allergic rhinitis: Secondary | ICD-10-CM | POA: Diagnosis not present

## 2022-09-29 NOTE — Progress Notes (Unsigned)
   FOLLOW UP  Date of Service/Encounter:  09/29/22   Assessment:   Seasonal and perennial allergic rhinitis (grasses, ragweed, weeds, trees, indoor molds, outdoor molds, dust mites, and cat)   GERD - on Protonix   Food sensitivities  Plan/Recommendations:    There are no Patient Instructions on file for this visit.   Subjective:   Kathy Graham is a 42 y.o. female presenting today for follow up of  Chief Complaint  Patient presents with   Follow-up    Haven't have a sinus infection since starting the new medication    Kathy Graham has a history of the following: Patient Active Problem List   Diagnosis Date Noted   Recurrent sinusitis 05/06/2022   Allergic rhinitis 05/06/2022   Annual physical exam 11/04/2021   Colon cancer screening    GERD (gastroesophageal reflux disease) 04/28/2019   Anxiety and depression 12/09/2012    History obtained from: chart review and {Persons; PED relatives w/patient:19415::"patient"}.  Kathy Graham is a 42 y.o. female presenting for {Blank single:19197::"a food challenge","a drug challenge","skin testing","a sick visit","an evaluation of ***","a follow up visit"}. She was last seen in February 2024.  At that time, she had testing that was positive to multiple indoor and outdoor allergens.  We stopped all of her current medications aside from Allegra.  We started her on Singulair and Ryaltris.  Since last visit, she has done well.   {Blank single:19197::"Asthma/Respiratory Symptom History: ***"," "}  Allergic Rhinitis Symptom History: She has been using the nose spray and she has been using it every morning. She has also used a couple of samples.   No sinus infections since January. She feels great. Oxygen variations have been more even.  {Blank single:19197::"Food Allergy Symptom History: ***"," "}  {Blank single:19197::"Skin Symptom History: ***"," "}  {Blank single:19197::"GERD Symptom History: ***"," "}  Otherwise, there  have been no changes to her past medical history, surgical history, family history, or social history.    ROS     Objective:   Blood pressure 104/60, pulse 62, temperature 97.9 F (36.6 C), temperature source Temporal, resp. rate 16, weight 133 lb 12.8 oz (60.7 kg), SpO2 100 %. Body mass index is 23.7 kg/m.    Physical Exam   Diagnostic studies: {Blank single:19197::"none","deferred due to recent antihistamine use","labs sent instead"," "}  Spirometry: {Blank single:19197::"results normal (FEV1: ***%, FVC: ***%, FEV1/FVC: ***%)","results abnormal (FEV1: ***%, FVC: ***%, FEV1/FVC: ***%)"}.    {Blank single:19197::"Spirometry consistent with mild obstructive disease","Spirometry consistent with moderate obstructive disease","Spirometry consistent with severe obstructive disease","Spirometry consistent with possible restrictive disease","Spirometry consistent with mixed obstructive and restrictive disease","Spirometry uninterpretable due to technique","Spirometry consistent with normal pattern"}. {Blank single:19197::"Albuterol/Atrovent nebulizer","Xopenex/Atrovent nebulizer","Albuterol nebulizer","Albuterol four puffs via MDI","Xopenex four puffs via MDI"} treatment given in clinic with {Blank single:19197::"significant improvement in FEV1 per ATS criteria","significant improvement in FVC per ATS criteria","significant improvement in FEV1 and FVC per ATS criteria","improvement in FEV1, but not significant per ATS criteria","improvement in FVC, but not significant per ATS criteria","improvement in FEV1 and FVC, but not significant per ATS criteria","no improvement"}.  Allergy Studies: {Blank single:19197::"none","labs sent instead"," "}    {Blank single:19197::"Allergy testing results were read and interpreted by myself, documented by clinical staff."," "}      Malachi Bonds, MD  [facility]

## 2022-09-29 NOTE — Patient Instructions (Addendum)
1. Seasonal and perennial allergic rhinitis - Previous testing demonstrated: grasses, ragweed, weeds, trees, indoor molds, outdoor molds, dust mites, and cat. - Continue with: Allegra (fexofenadine) 180mg  tablet once daily - Continue with: Singulair (montelukast) 10mg  daily and Ryaltris (olopatadine/mometasone) two sprays per nostril 1-2 times daily as needed - Singulair can cause irritability and bad dreams, so beware of that!  - You can use an extra dose of the antihistamine, if needed, for breakthrough symptoms.  - Consider nasal saline rinses 1-2 times daily to remove allergens from the nasal cavities as well as help with mucous clearance (this is especially helpful to do before the nasal sprays are given) - We can hold off on allergy shots for now.   2. Return in about 6 months (around 03/31/2023).    Please inform us of any Emergency Department visits, hospitalizations, or changes in symptoms. Call us before going to the ED for breathing or allergy symptoms since we might be able to fit you in for a sick visit. Feel free to contact us anytime with any questions, problems, or concerns.  It was a pleasure to see you again today!  Websites that have reliable patient information: 1. American Academy of Asthma, Allergy, and Immunology: www.aaaai.org 2. Food Allergy Research and Education (FARE): foodallergy.org 3. Mothers of Asthmatics: http://www.asthmacommunitynetwork.org 4. American College of Allergy, Asthma, and Immunology: www.acaai.org   COVID-19 Vaccine Information can be found at: [email or web address] For questions related to vaccine distribution or appointments, please email [email or web address] or call [phone].   We realize that you might be concerned about having an allergic reaction to the COVID19 vaccines. To help with that concern, WE ARE OFFERING THE COVID19 VACCINES IN OUR OFFICE! Ask the front desk for dates!      "Like" [place] on Facebook and Instagram for our latest updates!      A healthy democracy works best when [employer] participate! Make sure you are registered to vote! If you have moved or changed any of your contact information, you will need to get this updated before voting!  In some cases, you MAY be able to register to vote online: [email or web address]         Reducing Pollen Exposure  The American Academy of Allergy, Asthma and Immunology suggests the following steps to reduce your exposure to pollen during allergy seasons.    Do not hang sheets or clothing out to dry; pollen may collect on these items. Do not mow lawns or spend time around freshly cut grass; mowing stirs up pollen. Keep windows closed at night.  Keep car windows closed while driving. Minimize morning activities outdoors, a time when pollen counts are usually at their highest. Stay indoors as much as possible when pollen counts or humidity is high and on windy days when pollen tends to remain in the air longer. Use air conditioning when possible.  Many air conditioners have filters that trap the pollen spores. Use a HEPA room air filter to remove pollen form the indoor air you breathe.  Control of Mold Allergen   Mold and fungi can grow on a variety of surfaces provided certain temperature and moisture conditions exist.  Outdoor molds grow on plants, decaying vegetation and soil.  The major outdoor mold, Alternaria and Cladosporium, are found in very high numbers during hot and dry conditions.  Generally, a late Summer - Fall peak is seen for common outdoor fungal spores.  Rain will temporarily lower outdoor mold spore count, but counts rise rapidly when the rainy period  ends.  The most important indoor molds are Aspergillus and Penicillium.  Dark, humid and poorly ventilated basements are ideal sites for mold growth.  The next most common sites of mold growth are the bathroom and the  kitchen.  Outdoor (Seasonal) Mold Control  Positive outdoor molds via skin testing: Alternaria, Cladosporium, Bipolaris (Helminthsporium), Drechslera (Curvalaria), and Mucor  Use air conditioning and keep windows closed Avoid exposure to decaying vegetation. Avoid leaf raking. Avoid grain handling. Consider wearing a face mask if working in moldy areas.   Indoor (Perennial) Mold Control   Positive indoor molds via skin testing: Aspergillus, Penicillium, Fusarium, Aureobasidium (Pullulara), and Rhizopus  Maintain humidity below 50%. Clean washable surfaces with 5% bleach solution. Remove sources e.g. contaminated carpets.    Control of Dog or Cat Allergen  Avoidance is the best way to manage a dog or cat allergy. If you have a dog or cat and are allergic to dog or cats, consider removing the dog or cat from the home. If you have a dog or cat but don't want to find it a new home, or if your family wants a pet even though someone in the household is allergic, here are some strategies that may help keep symptoms at bay:  Keep the pet out of your bedroom and restrict it to only a few rooms. Be advised that keeping the dog or cat in only one room will not limit the allergens to that room. Don't pet, hug or kiss the dog or cat; if you do, wash your hands with soap and water. High-efficiency particulate air (HEPA) cleaners run continuously in a bedroom or living room can reduce allergen levels over time. Regular use of a high-efficiency vacuum cleaner or a central vacuum can reduce allergen levels. Giving your dog or cat a bath at least once a week can reduce airborne allergen.  Control of Dust Mite Allergen    Dust mites play a major role in allergic asthma and rhinitis.  They occur in environments with high humidity wherever human skin is found.  Dust mites absorb humidity from the atmosphere (ie, they do not drink) and feed on organic matter (including shed human and animal skin).   Dust mites are a microscopic type of insect that you cannot see with the naked eye.  High levels of dust mites have been detected from mattresses, pillows, carpets, upholstered furniture, bed covers, clothes, soft toys and any woven material.  The principal allergen of the dust mite is found in its feces.  A gram of dust may contain 1,000 mites and 250,000 fecal particles.  Mite antigen is easily measured in the air during house cleaning activities.  Dust mites do not bite and do not cause harm to humans, other than by triggering allergies/asthma.    Ways to decrease your exposure to dust mites in your home:  Encase mattresses, box springs and pillows with a mite-impermeable barrier or cover   Wash sheets, blankets and drapes weekly in hot water (130 F) with detergent and dry them in a dryer on the hot setting.  Have the room cleaned frequently with a vacuum cleaner and a damp dust-mop.  For carpeting or rugs, vacuuming with a vacuum cleaner equipped with a high-efficiency particulate air (HEPA) filter.  The dust mite allergic individual should not be in a room which is being cleaned and should wait 1 hour after cleaning before going into the room. Do not sleep on upholstered furniture (eg, couches).   If possible removing  carpeting, upholstered furniture and drapery from the home is ideal.  Horizontal blinds should be eliminated in the rooms where the person spends the most time (bedroom, study, television room).  Washable vinyl, roller-type shades are optimal. Remove all non-washable stuffed toys from the bedroom.  Wash stuffed toys weekly like sheets and blankets above.   Reduce indoor humidity to less than 50%.  Inexpensive humidity monitors can be purchased at most hardware stores.  Do not use a humidifier as can make the problem worse and are not recommended.   Allergy Shots  Allergies are the result of a chain reaction that starts in the immune system. Your immune system controls how your body  defends itself. For instance, if you have an allergy to pollen, your immune system identifies pollen as an invader or allergen. Your immune system overreacts by producing antibodies called Immunoglobulin E (IgE). These antibodies travel to cells that release chemicals, causing an allergic reaction.  The concept behind allergy immunotherapy, whether it is received in the form of shots or tablets, is that the immune system can be desensitized to specific allergens that trigger allergy symptoms. Although it requires time and patience, the payback can be long-term relief. Allergy injections contain a dilute solution of those substances that you are allergic to based upon your skin testing and allergy history.   How Do Allergy Shots Work?  Allergy shots work much like a vaccine. Your body responds to injected amounts of a particular allergen given in increasing doses, eventually developing a resistance and tolerance to it. Allergy shots can lead to decreased, minimal or no allergy symptoms.  There generally are two phases: build-up and maintenance. Build-up often ranges from three to six months and involves receiving injections with increasing amounts of the allergens. The shots are typically given once or twice a week, though more rapid build-up schedules are sometimes used.  The maintenance phase begins when the most effective dose is reached. This dose is different for each person, depending on how allergic you are and your response to the build-up injections. Once the maintenance dose is reached, there are longer periods between injections, typically two to four weeks.  Occasionally doctors give cortisone-type shots that can temporarily reduce allergy symptoms. These types of shots are different and should not be confused with allergy immunotherapy shots.  Who Can Be Treated with Allergy Shots?  Allergy shots may be a good treatment approach for people with allergic rhinitis (hay fever), allergic  asthma, conjunctivitis (eye allergy) or stinging insect allergy.   Before deciding to begin allergy shots, you should consider:   The length of allergy season and the severity of your symptoms  Whether medications and/or changes to your environment can control your symptoms  Your desire to avoid long-term medication use  Time: allergy immunotherapy requires a major time commitment  Cost: may vary depending on your insurance coverage  Allergy shots for children age 31 and older are effective and often well tolerated. They might prevent the onset of new allergen sensitivities or the progression to asthma.  Allergy shots are not started on patients who are pregnant but can be continued on patients who become pregnant while receiving them. In some patients with other medical conditions or who take certain common medications, allergy shots may be of risk. It is important to mention other medications you talk to your allergist.   What are the two types of build-ups offered:   RUSH or Rapid Desensitization -- one day of injections lasting from 8:30-4:30pm,  injections every 1 hour.  Approximately half of the build-up process is completed in that one day.  The following week, normal build-up is resumed, and this entails ~16 visits either weekly or twice weekly, until reaching your "maintenance dose" which is continued weekly until eventually getting spaced out to every month for a duration of 3 to 5 years. The regular build-up appointments are nurse visits where the injections are administered, followed by required monitoring for 30 minutes.    Traditional build-up -- weekly visits for 6 -12 months until reaching "maintenance dose", then continue weekly until eventually spacing out to every 4 weeks as above. At these appointments, the injections are administered, followed by required monitoring for 30 minutes.     Either way is acceptable, and both are equally effective. With the rush protocol, the  advantage is that less time is spent here for injections overall AND you would also reach maintenance dosing faster (which is when the clinical benefit starts to become apparent).  Not everyone is a candidate for rapid desensitization.   IF we proceed with the RUSH protocol, there are premedications which must be taken the day before and the day after the rush only (this includes antihistamines, steroids, and Singulair).  After the rush day, no prednisone or Singulair is required, and we just recommend antihistamines taken on your injection day.  What Is An Estimate of the Costs?  If you are interested in starting allergy injections, please check with your insurance company about your coverage for both allergy vial sets and allergy injections.  Please do so prior to making the appointment to start injections.  The following are CPT codes to give to your insurance company. These are the amounts we bill to [employer], but the amount you will pay and we receive is substantially less, depending on the contracts we have with different insurance companies.   Amount Billed to Insurance One allergy vial set  CPT 95165   $ 1200     Two allergy vial set  CPT 95165   $ 2400     Three allergy vial set  CPT 95165   $ 3600     One injection   CPT 95115   $ 35  Two injections   CPT 95117   $ 40 RUSH (Rapid Desensitization) CPT 95180 x 8 hours $500/hour  Regarding the allergy injections, your co-pay may or may not apply with each injection, so please confirm this with your insurance company. When you start allergy injections, 1 or 2 sets of vials are made based on your allergies.  Not all patients can be on one set of vials. A set of vials lasts 6 months to a year depending on how quickly you can proceed with your build-up of your allergy injections. Vials are personalized for each patient depending on their specific allergens.  How often are allergy injection given during the build-up period?    Injections are given at least weekly during the build-up period until your maintenance dose is achieved. Per the doctor's discretion, you may have the option of getting allergy injections two times per week during the build-up period. However, there must be at least 48 hours between injections. The build-up period is usually completed within 6-12 months depending on your ability to schedule injections and for adjustments for reactions. When maintenance dose is reached, your injection schedule is gradually changed to every two weeks and later to every three weeks. Injections will then continue every 4 weeks. Usually,  injections are continued for a total of 3-5 years.   When Will I Feel Better?  Some may experience decreased allergy symptoms during the build-up phase. For others, it may take as long as 12 months on the maintenance dose. If there is no improvement after a year of maintenance, your allergist will discuss other treatment options with you.  If you aren't responding to allergy shots, it may be because there is not enough dose of the allergen in your vaccine or there are missing allergens that were not identified during your allergy testing. Other reasons could be that there are high levels of the allergen in your environment or major exposure to non-allergic triggers like tobacco smoke.  What Is the Length of Treatment?  Once the maintenance dose is reached, allergy shots are generally continued for three to five years. The decision to stop should be discussed with your allergist at that time. Some people may experience a permanent reduction of allergy symptoms. Others may relapse and a longer course of allergy shots can be considered.  What Are the Possible Reactions?  The two types of adverse reactions that can occur with allergy shots are local and systemic. Common local reactions include very mild redness and swelling at the injection site, which can happen immediately or several  hours after. Report a delayed reaction from your last injection. These include arm swelling or runny nose, watery eyes or cough that occurs within 12-24 hours after injection. A systemic reaction, which is less common, affects the entire body or a particular body system. They are usually mild and typically respond quickly to medications. Signs include increased allergy symptoms such as sneezing, a stuffy nose or hives.   Rarely, a serious systemic reaction called anaphylaxis can develop. Symptoms include swelling in the throat, wheezing, a feeling of tightness in the chest, nausea or dizziness. Most serious systemic reactions develop within 30 minutes of allergy shots. This is why it is strongly recommended you wait in your doctor's office for 30 minutes after your injections. Your allergist is trained to watch for reactions, and his or her staff is trained and equipped with the proper medications to identify and treat them.   Report to the nurse immediately if you experience any of the following symptoms: swelling, itching or redness of the skin, hives, watery eyes/nose, breathing difficulty, excessive sneezing, coughing, stomach pain, diarrhea, or light headedness. These symptoms may occur within 15-20 minutes after injection and may require medication.   Who Should Administer Allergy Shots?  The preferred location for receiving shots is your prescribing allergist's office. Injections can sometimes be given at another facility where the physician and staff are trained to recognize and treat reactions, and have received instructions by your prescribing allergist.  What if I am late for an injection?   Injection dose will be adjusted depending upon how many days or weeks you are late for your injection.   What if I am sick?   Please report any illness to the nurse before receiving injections. She may adjust your dose or postpone injections depending on your symptoms. If you have fever, flu, sinus  infection or chest congestion it is best to postpone allergy injections until you are better. Never get an allergy injection if your asthma is causing you problems. If your symptoms persist, seek out medical care to get your health problem under control.  What If I am or Become Pregnant:  Women that become pregnant should schedule an appointment with [facility] before receiving any further allergy injections.

## 2022-09-30 ENCOUNTER — Encounter: Payer: Self-pay | Admitting: Allergy & Immunology

## 2022-09-30 MED ORDER — RYALTRIS 665-25 MCG/ACT NA SUSP: NASAL | 5 refills | Status: DC

## 2022-09-30 MED ORDER — PANTOPRAZOLE SODIUM 40 MG PO TBEC: 40.0000 mg | DELAYED_RELEASE_TABLET | Freq: Two times a day (BID) | ORAL | 3 refills | Status: DC

## 2022-09-30 MED ORDER — FEXOFENADINE HCL 180 MG PO TABS: 180.0000 mg | ORAL_TABLET | Freq: Every day | ORAL | 2 refills | Status: DC

## 2023-01-18 ENCOUNTER — Telehealth: Payer: BC Managed Care – PPO | Admitting: Physician Assistant

## 2023-01-18 DIAGNOSIS — T63461A Toxic effect of venom of wasps, accidental (unintentional), initial encounter: Secondary | ICD-10-CM

## 2023-01-18 MED ORDER — PREDNISONE 20 MG PO TABS
40.0000 mg | ORAL_TABLET | Freq: Every day | ORAL | 0 refills | Status: DC
Start: 2023-01-18 — End: 2023-03-30

## 2023-01-18 MED ORDER — DOXYCYCLINE HYCLATE 100 MG PO TABS
100.0000 mg | ORAL_TABLET | Freq: Two times a day (BID) | ORAL | 0 refills | Status: DC
Start: 2023-01-18 — End: 2023-03-30

## 2023-01-18 NOTE — Progress Notes (Signed)
I have spent 5 minutes in review of e-visit questionnaire, review and updating patient chart, medical decision making and response to patient.   William Cody Martin, PA-C    

## 2023-01-18 NOTE — Progress Notes (Signed)
E-Visit for Insect Sting  Thank you for describing the insect sting for [place].  Here is how we plan to help!  Based on what you have shared with me, you have a secondary cellulitis (infection of the skin) from the sting itself.   The 2 greatest risks from insect stings are allergic reaction, which can be fatal in some people and infection, which is more common and less serious.  Bees, wasps, yellow jackets, and hornets belong to a class of insects called Hymenoptera.  Most insect stings cause only minor discomfort.  Stings can happen anywhere on the body and can be painful.  Most stings are from honey bees or yellow jackets.  Fire ants can sting multiple times.  The sites of the stings are more likely to become infected.    I have sent in doxycycline to take as directed for infection and prednisone 40 mg by mouth daily for 5 days to further reduce swelling and inflammation to the pharmacy you selected.  Please make sure that you selected a pharmacy that is open now.  What can be used to prevent Insect Stings?  Insect repellant with at least 20% DEET.  Wearing long pants and shirts with socks and shoes.  Wear dark or drab-colored clothes rather than bright colors.  Avoid using perfumes and hair sprays; these attract insects.  HOME CARE ADVICE:  1. Stinger removal: The stinger looks like a tiny black dot in the sting. Use a fingernail, credit card edge, or knife-edge to scrape it off.  Don't pull it out because it squeezes out more venom. If the stinger is below the skin surface, leave it alone.  It will be shed with normal skin healing. 2. Use cold compresses to the area of the sting for 10-20 minutes.  You may repeat this as needed to relieve symptoms of pain and swelling. 3.  For pain relief, take acetominophen 650 mg 4-6 hours as needed or ibuprofen 400 mg every 6-8 hours as needed or naproxen 250-500 mg every 12 hours as needed. 4.  You can also use hydrocortisone cream 0.5% or 1% up to  4 times daily as needed for itching. 5.  If the sting becomes very itchy, take Benadryl 25-50 mg, follow directions on box. 6.  Wash the area 2-3 times daily with antibacterial soap and warm water. 7. Call your Doctor if: Fever, a severe headache, or rash occur in the next 2 weeks. Sting area begins to look infected. Redness and swelling worsens after home treatment. Your current symptoms become worse.    MAKE SURE YOU:  Understand these instructions. Will watch your condition. Will get help right away if you are not doing well or get worse.  Thank you for choosing an e-visit.  Your e-visit answers were reviewed by a board certified advanced clinical practitioner to complete your personal care plan. Depending upon the condition, your plan could have included both over the counter or prescription medications.  Please review your pharmacy choice. Make sure the pharmacy is open so you can pick up prescription now. If there is a problem, you may contact your provider through [employer] and have the prescription routed to another pharmacy.  Your safety is important to [place]. If you have drug allergies check your prescription carefully.   For the next 24 hours you can use MyChart to ask questions about today's visit, request a non-urgent call back, or ask for a work or school excuse. You will get an email in the  next two days asking about your experience. I hope that your e-visit has been valuable and will speed your recovery.

## 2023-01-28 ENCOUNTER — Other Ambulatory Visit: Payer: Self-pay | Admitting: Allergy & Immunology

## 2023-03-30 ENCOUNTER — Other Ambulatory Visit: Payer: Self-pay

## 2023-03-30 ENCOUNTER — Ambulatory Visit: Payer: BC Managed Care – PPO | Admitting: Allergy & Immunology

## 2023-03-30 ENCOUNTER — Encounter: Payer: Self-pay | Admitting: Allergy & Immunology

## 2023-03-30 VITALS — BP 100/70 | HR 60 | Temp 98.2°F | Resp 16 | Ht 63.0 in | Wt 132.1 lb

## 2023-03-30 DIAGNOSIS — J302 Other seasonal allergic rhinitis: Secondary | ICD-10-CM | POA: Diagnosis not present

## 2023-03-30 DIAGNOSIS — J3089 Other allergic rhinitis: Secondary | ICD-10-CM

## 2023-03-30 DIAGNOSIS — K219 Gastro-esophageal reflux disease without esophagitis: Secondary | ICD-10-CM

## 2023-03-30 MED ORDER — MONTELUKAST SODIUM 10 MG PO TABS: 10.0000 mg | ORAL_TABLET | Freq: Every day | ORAL | 3 refills | Status: DC

## 2023-03-30 NOTE — Patient Instructions (Addendum)
1. Seasonal and perennial allergic rhinitis - Previous testing demonstrated: grasses, ragweed, weeds, trees, indoor molds, outdoor molds, dust mites, and cat. - Continue with: Allegra (fexofenadine) 180mg  tablet once daily - Continue with: Singulair (montelukast) 10mg  daily and Ryaltris (olopatadine/mometasone) two sprays per nostril 1-2 times daily as needed - Singulair can cause irritability and bad dreams, so beware of that!  - You can use an extra dose of the antihistamine, if needed, for breakthrough symptoms.  - Consider nasal saline rinses 1-2 times daily to remove allergens from the nasal cavities as well as help with mucous clearance (this is especially helpful to do before the nasal sprays are given) - We can hold off on allergy shots for now.   2. Return in about 1 year (around 03/29/2024).    Please inform us of any Emergency Department visits, hospitalizations, or changes in symptoms. Call us before going to the ED for breathing or allergy symptoms since we might be able to fit you in for a sick visit. Feel free to contact us anytime with any questions, problems, or concerns.  It was a pleasure to see you again today!  Websites that have reliable patient information: 1. American Academy of Asthma, Allergy, and Immunology: www.aaaai.org 2. Food Allergy Research and Education (FARE): foodallergy.org 3. Mothers of Asthmatics: http://www.asthmacommunitynetwork.org 4. American College of Allergy, Asthma, and Immunology: www.acaai.org   COVID-19 Vaccine Information can be found at: [email or web address] For questions related to vaccine distribution or appointments, please email [email or web address] or call [phone].   We realize that you might be concerned about having an allergic reaction to the COVID19 vaccines. To help with that concern, WE ARE OFFERING THE COVID19 VACCINES IN OUR OFFICE! Ask the front desk for dates!      "Like" [place] on Facebook and Instagram for our latest updates!      A healthy democracy works best when [employer] participate! Make sure you are registered to vote! If you have moved or changed any of your contact information, you will need to get this updated before voting!  In some cases, you MAY be able to register to vote online: [email or web address]

## 2023-03-30 NOTE — Progress Notes (Signed)
FOLLOW UP  Date of Service/Encounter:  03/30/23   Assessment:   Seasonal and perennial allergic rhinitis (grasses, ragweed, weeds, trees, indoor molds, outdoor molds, dust mites, and cat) - doing well with medications alone   GERD - on Protonix   Food sensitivities   Plan/Recommendations:   1. Seasonal and perennial allergic rhinitis - Previous testing demonstrated: grasses, ragweed, weeds, trees, indoor molds, outdoor molds, dust mites, and cat. - Continue with: Allegra (fexofenadine) 180mg  tablet once daily - Continue with: Singulair (montelukast) 10mg  daily and Ryaltris (olopatadine/mometasone) two sprays per nostril 1-2 times daily as needed - Singulair can cause irritability and bad dreams, so beware of that!  - You can use an extra dose of the antihistamine, if needed, for breakthrough symptoms.  - Consider nasal saline rinses 1-2 times daily to remove allergens from the nasal cavities as well as help with mucous clearance (this is especially helpful to do before the nasal sprays are given) - We can hold off on allergy shots for now.   2. Return in about 1 year (around 03/29/2024).   Subjective:   Kathy Graham is a 42 y.o. female presenting today for follow up of  Chief Complaint  Patient presents with   Seasonal and Perennial Allergic Rhinitis    6 mth f/u - So much better   Gastroesophageal Reflux    6 mth f/u - Good    [name] has a history of the following: Patient Active Problem List   Diagnosis Date Noted   Recurrent sinusitis 05/06/2022   Allergic rhinitis 05/06/2022   Annual physical exam 11/04/2021   Colon cancer screening    GERD (gastroesophageal reflux disease) 04/28/2019   Anxiety and depression 12/09/2012    History obtained from: chart review and patient.  Discussed the use of AI scribe software for clinical note transcription with the patient and/or guardian, who gave verbal consent to proceed.  Kathy Graham is a 41 y.o. female  presenting for a follow up visit.  She was last seen in5 and April 2024.  At that time, we continued with Allegra as well as Singulair and Ryaltris.      The patient, with a history of chronic sinusitis, reports significant improvement in her condition. She has been adhering to a regimen of Allegra, Singulair, and Ryaltris, and has not experienced a sinus infection since January, a marked improvement from her previous frequency of one every one to two months. She uses Ryaltris daily and has found it to be effective.  She has noticed that the Ryaltris last longer than a month.  However, she has noticed a new symptom over the past four to five weeks, specifically in her right nostril. She describes the mucus as hardening, akin to cement, causing discomfort and occasional bleeding when attempting to remove it. This symptom appears to be seasonal, as she recalls a similar occurrence the previous fall.  However, she is not having any epistaxis that is uncontrollable.  The patient also mentions taking an acid reflux pill before dinner, but does not elaborate on the severity or frequency of her reflux symptoms.  She remains on Protonix daily.  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 She does not mention any other health concerns or symptoms during the consultation.     Her classes are going well.  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  She is living in a new house in [place].  She always find something to do in downtown [place], which is a big contrast to [employer].  Otherwise, there have been no changes to her past medical history, surgical history, family history, or social history.    Review of systems otherwise negative other than that mentioned in the HPI.    Objective:   Blood pressure 100/70, pulse 60,  temperature 98.2 F (36.8 C), temperature source Temporal, resp. rate 16, height 5\' 3"  (1.6 m), weight 132 lb 1.6 oz (59.9 kg), SpO2 100%. Body mass index is 23.4 kg/m.    Physical Exam Vitals reviewed.  Constitutional:      Appearance: Normal appearance. She is well-developed.     Comments: Very pleasant.  Talkative.  HENT:     Head: Normocephalic and atraumatic.     Right Ear: Tympanic membrane, ear canal and external ear normal. No drainage, swelling or tenderness. Tympanic membrane is not injected, scarred, erythematous, retracted or bulging.     Left Ear: Tympanic membrane, ear canal and external ear normal. No drainage, swelling or tenderness. Tympanic membrane is not injected, scarred, erythematous, retracted or bulging.     Nose: Rhinorrhea present. No nasal deformity, septal deviation or mucosal edema.     Right Turbinates: Enlarged, swollen and pale.     Left Turbinates: Enlarged, swollen and pale.     Right Sinus: No maxillary sinus tenderness or frontal sinus tenderness.     Left Sinus: No maxillary sinus tenderness or frontal sinus tenderness.     Mouth/Throat:     Mouth: Mucous membranes are not pale and not dry.     Pharynx: Uvula midline.  Eyes:     General:        Right eye: No discharge.        Left eye: No discharge.     Conjunctiva/sclera: Conjunctivae normal.     Right eye: Right conjunctiva is not injected. No chemosis.    Left eye: Left conjunctiva is not injected. No chemosis.    Pupils: Pupils are equal, round, and reactive to light.  Cardiovascular:     Rate and Rhythm: Normal rate and regular rhythm.     Heart sounds: Normal heart sounds.  Pulmonary:     Effort: Pulmonary effort is normal. No tachypnea, accessory muscle usage or respiratory distress.     Breath sounds: Normal breath sounds. No wheezing, rhonchi or rales.     Comments: Moving air well in all lung fields. No increased work of breathing noted.  Chest:     Chest wall: No tenderness.   Lymphadenopathy:     Head:     Right side of head: No submandibular, tonsillar or occipital adenopathy.     Left side of head: No submandibular, tonsillar or occipital adenopathy.     Cervical: No cervical adenopathy.  Skin:    Coloration: Skin is not pale.     Findings: No abrasion, erythema, petechiae or rash. Rash is not papular, urticarial or vesicular.  Neurological:     Mental Status: She is alert.  Psychiatric:        Behavior: Behavior is cooperative.      Diagnostic studies: none      Malachi Bonds, MD  [facility]

## 2023-04-15 ENCOUNTER — Other Ambulatory Visit: Payer: Self-pay | Admitting: Allergy & Immunology

## 2023-08-17 ENCOUNTER — Telehealth: Payer: 59 | Admitting: Nurse Practitioner

## 2023-08-17 DIAGNOSIS — J0140 Acute pansinusitis, unspecified: Secondary | ICD-10-CM

## 2023-08-17 MED ORDER — IPRATROPIUM BROMIDE 0.03 % NA SOLN
2.0000 | Freq: Two times a day (BID) | NASAL | 12 refills | Status: DC
Start: 2023-08-17 — End: 2024-03-28

## 2023-08-17 MED ORDER — DOXYCYCLINE HYCLATE 100 MG PO TABS
100.0000 mg | ORAL_TABLET | Freq: Two times a day (BID) | ORAL | 0 refills | Status: AC
Start: 2023-08-17 — End: 2023-08-27

## 2023-08-17 NOTE — Progress Notes (Signed)
 E-Visit for Sinus Problems  We are sorry that you are not feeling well.  Here is how we plan to help!  Based on what you have shared with me it looks like you have sinusitis.  Sinusitis is inflammation and infection in the sinus cavities of the head.  Based on your presentation I believe you most likely have Acute Bacterial Sinusitis.  This is an infection caused by bacteria and is treated with antibiotics. I have prescribed Doxycycline 100mg  by mouth twice a day for 10 days. And a nasal spray. If you are currently using a nasal spray please continue, if you do not have one we have called one in for your.   Meds ordered this encounter  Medications   doxycycline (VIBRA-TABS) 100 MG tablet    Sig: Take 1 tablet (100 mg total) by mouth 2 (two) times daily for 10 days.    Dispense:  20 tablet    Refill:  0   ipratropium (ATROVENT) 0.03 % nasal spray    Sig: Place 2 sprays into both nostrils every 12 (twelve) hours.    Dispense:  30 mL    Refill:  12    You may use an oral decongestant such as Mucinex D or if you have glaucoma or high blood pressure use plain Mucinex. Saline nasal spray help and can safely be used as often as needed for congestion.  If you develop worsening sinus pain, fever or notice severe headache and vision changes, or if symptoms are not better after completion of antibiotic, please schedule an appointment with a health care provider.    Sinus infections are not as easily transmitted as other respiratory infection, however we still recommend that you avoid close contact with loved ones, especially the very young and elderly.  Remember to wash your hands thoroughly throughout the day as this is the number one way to prevent the spread of infection!  Home Care: Only take medications as instructed by your medical team. Complete the entire course of an antibiotic. Do not take these medications with alcohol. A steam or ultrasonic humidifier can help congestion.  You can place a  towel over your head and breathe in the steam from hot water coming from a faucet. Avoid close contacts especially the very young and the elderly. Cover your mouth when you cough or sneeze. Always remember to wash your hands.  Get Help Right Away If: You develop worsening fever or sinus pain. You develop a severe head ache or visual changes. Your symptoms persist after you have completed your treatment plan.  Make sure you Understand these instructions. Will watch your condition. Will get help right away if you are not doing well or get worse.  Thank you for choosing an e-visit.  Your e-visit answers were reviewed by a board certified advanced clinical practitioner to complete your personal care plan. Depending upon the condition, your plan could have included both over the counter or prescription medications.  Please review your pharmacy choice. Make sure the pharmacy is open so you can pick up prescription now. If there is a problem, you may contact your provider through [employer] and have the prescription routed to another pharmacy.  Your safety is important to [place]. If you have drug allergies check your prescription carefully.   For the next 24 hours you can use MyChart to ask questions about today's visit, request a non-urgent call back, or ask for a work or school excuse. You will get an email in the  next two days asking about your experience. I hope that your e-visit has been valuable and will speed your recovery.   I spent approximately 5 minutes reviewing the patient's history, current symptoms and coordinating their care today.

## 2023-08-30 ENCOUNTER — Other Ambulatory Visit: Payer: Self-pay | Admitting: Allergy & Immunology

## 2023-10-04 ENCOUNTER — Other Ambulatory Visit: Payer: Self-pay | Admitting: Obstetrics

## 2023-10-04 DIAGNOSIS — R928 Other abnormal and inconclusive findings on diagnostic imaging of breast: Secondary | ICD-10-CM

## 2023-10-08 ENCOUNTER — Ambulatory Visit
Admission: RE | Admit: 2023-10-08 | Discharge: 2023-10-08 | Disposition: A | Discharge status: Home / Self Care | Source: Ambulatory Visit | Attending: Obstetrics | Admitting: Obstetrics

## 2023-10-08 DIAGNOSIS — R928 Other abnormal and inconclusive findings on diagnostic imaging of breast: Secondary | ICD-10-CM

## 2023-10-14 ENCOUNTER — Other Ambulatory Visit

## 2023-11-01 ENCOUNTER — Telehealth: Payer: Self-pay | Admitting: Allergy & Immunology

## 2023-11-01 NOTE — Telephone Encounter (Signed)
 Kathy Graham called in and states she needs an updated prescription for Ryaltris  sent to the specialty pharmacy.

## 2023-11-02 ENCOUNTER — Other Ambulatory Visit: Payer: Self-pay

## 2023-11-02 MED ORDER — RYALTRIS 665-25 MCG/ACT NA SUSP: NASAL | 5 refills | Status: DC

## 2023-11-02 NOTE — Telephone Encounter (Signed)
 Refill prescription on Ryaltri has been sent to [facility].

## 2023-11-30 NOTE — Telephone Encounter (Signed)
 Copied from CRM [id number]. Topic: Clinical - Request for Lab/Test Order >> Nov 30, 2023 11:00 AM Kathy Graham wrote: Reason for CRM: Pt would like to do labs before physical on 06/18 pt would need lab order.    Once lab order in please call pt to schedule.

## 2023-12-01 ENCOUNTER — Other Ambulatory Visit: Payer: Self-pay | Admitting: Family Medicine

## 2023-12-01 DIAGNOSIS — D509 Iron deficiency anemia, unspecified: Secondary | ICD-10-CM

## 2023-12-01 DIAGNOSIS — Z13228 Encounter for screening for other metabolic disorders: Secondary | ICD-10-CM

## 2023-12-01 DIAGNOSIS — E785 Hyperlipidemia, unspecified: Secondary | ICD-10-CM

## 2023-12-08 ENCOUNTER — Ambulatory Visit: Admitting: Family Medicine

## 2023-12-08 VITALS — BP 115/68 | HR 71 | Temp 98.8°F | Ht 63.0 in | Wt 131.8 lb

## 2023-12-08 DIAGNOSIS — Z0000 Encounter for general adult medical examination without abnormal findings: Secondary | ICD-10-CM | POA: Diagnosis not present

## 2023-12-08 LAB — IRON,TIBC AND FERRITIN PANEL
Ferritin: 73 ng/mL (ref 15–150)
Iron Saturation: 18 % (ref 15–55)
Iron: 65 ug/dL (ref 27–159)
Total Iron Binding Capacity: 363 ug/dL (ref 250–450)
UIBC: 298 ug/dL (ref 131–425)

## 2023-12-08 LAB — LIPID PANEL
Chol/HDL Ratio: 2.8 ratio (ref 0.0–4.4)
Cholesterol, Total: 222 mg/dL — ABNORMAL HIGH (ref 100–199)
HDL: 79 mg/dL (ref >39)
LDL Chol Calc (NIH): 122 mg/dL — ABNORMAL HIGH (ref 0–99)
Triglycerides: 124 mg/dL (ref 0–149)
VLDL Cholesterol Cal: 21 mg/dL (ref 5–40)

## 2023-12-08 LAB — CBC
Hematocrit: 38.8 % (ref 34.0–46.6)
Hemoglobin: 12.4 g/dL (ref 11.1–15.9)
MCH: 29.4 pg (ref 26.6–33.0)
MCHC: 32 g/dL (ref 31.5–35.7)
MCV: 92 fL (ref 79–97)
Platelets: 318 10*3/uL (ref 150–450)
RBC: 4.22 x10E6/uL (ref 3.77–5.28)
RDW: 12.5 % (ref 11.7–15.4)
WBC: 6.3 10*3/uL (ref 3.4–10.8)

## 2023-12-08 LAB — CMP14+EGFR
ALT: 14 IU/L (ref 0–32)
AST: 14 IU/L (ref 0–40)
Albumin: 4.3 g/dL (ref 3.9–4.9)
Alkaline Phosphatase: 65 IU/L (ref 44–121)
BUN/Creatinine Ratio: 21 (ref 9–23)
BUN: 14 mg/dL (ref 6–24)
Bilirubin Total: <0.2 mg/dL (ref 0.0–1.2)
CO2: 21 mmol/L (ref 20–29)
Calcium: 9.4 mg/dL (ref 8.7–10.2)
Chloride: 102 mmol/L (ref 96–106)
Creatinine, Ser: 0.66 mg/dL (ref 0.57–1.00)
Globulin, Total: 2.8 g/dL (ref 1.5–4.5)
Glucose: 79 mg/dL (ref 70–99)
Potassium: 4.9 mmol/L (ref 3.5–5.2)
Sodium: 137 mmol/L (ref 134–144)
Total Protein: 7.1 g/dL (ref 6.0–8.5)
eGFR: 110 mL/min/{1.73_m2} (ref >59)

## 2023-12-08 NOTE — Assessment & Plan Note (Signed)
 Doing well.  Need records from [facility].  Preventative health care is up-to-date.

## 2023-12-08 NOTE — Progress Notes (Signed)
 Subjective:  Patient ID: Kathy Graham, female    DOB: [birth year]  Age: 43 y.o. MRN: [id number]  CC: Annual exam   HPI:  43 year old female presents for annual physical exam.  Patient states that she is doing well.  Colonoscopy up-to-date.  Mammogram and Pap smear up-to-date.  Need records from [facility].  Immunizations up-to-date.  Patient denies chest pain or shortness of breath.  She has no complaints at this time.  States that she is doing well.  Patient Active Problem List   Diagnosis Date Noted   Recurrent sinusitis 05/06/2022   Allergic rhinitis 05/06/2022   Annual physical exam 11/04/2021   GERD (gastroesophageal reflux disease) 04/28/2019   Anxiety and depression 12/09/2012    Social Hx   Social History   Socioeconomic History   Marital status: Unknown    Spouse name: Not on file   Number of children: Not on file   Years of education: Not on file   Highest education level: Master's degree (e.g., MA, MS, MEng, MEd, MSW, MBA)  Occupational History   Not on file  Tobacco Use   Smoking status: Never    Passive exposure: Never   Smokeless tobacco: Never  Vaping Use   Vaping status: Never Used  Substance and Sexual Activity   Alcohol use: Yes    Comment: occ glass of wine   Drug use: No   Sexual activity: Not Currently    Birth control/protection: Pill  Other Topics Concern   Not on file  Social History Narrative   Not on file   Social Drivers of Health   Financial Resource Strain: Low Risk  (12/08/2023)   Overall Financial Resource Strain (CARDIA)    Difficulty of Paying Living Expenses: Not hard at all  Food Insecurity: No Food Insecurity (12/08/2023)   Hunger Vital Sign    Worried About Running Out of Food in the Last Year: Never true    Ran Out of Food in the Last Year: Never true  Transportation Needs: No Transportation Needs (12/08/2023)   PRAPARE - Administrator, Civil Service (Medical): No    Lack of Transportation  (Non-Medical): No  Physical Activity: Sufficiently Active (12/08/2023)   Exercise Vital Sign    Days of Exercise per Week: 6 days    Minutes of Exercise per Session: 50 min  Stress: No Stress Concern Present (12/08/2023)   [employer] of Occupational Health - Occupational Stress Questionnaire    Feeling of Stress: Only a little  Social Connections: Moderately Isolated (12/08/2023)   Social Connection and Isolation Panel    Frequency of Communication with Friends and Family: More than three times a week    Frequency of Social Gatherings with Friends and Family: Twice a week    Attends Religious Services: Never    Database administrator or Organizations: Yes    Attends Engineer, structural: More than 4 times per year    Marital Status: Never married    Review of Systems Per HPI  Objective:  BP 115/68   Pulse 71   Temp 98.8 F (37.1 C)   Ht 5' 3 (1.6 m)   Wt 131 lb 12.8 oz (59.8 kg)   SpO2 100%   BMI 23.35 kg/m      12/08/2023    3:19 PM 03/30/2023    3:56 PM 09/29/2022    4:00 PM  BP/Weight  Systolic BP 115 100 104  Diastolic BP 68 70 60  Wt. (  Lbs) 131.8 132.1 133.8  BMI 23.35 kg/m2 23.4 kg/m2 23.7 kg/m2    Physical Exam Vitals and nursing note reviewed.  Constitutional:      General: She is not in acute distress.    Appearance: Normal appearance.  HENT:     Head: Normocephalic and atraumatic.   Eyes:     General:        Right eye: No discharge.        Left eye: No discharge.     Conjunctiva/sclera: Conjunctivae normal.    Cardiovascular:     Rate and Rhythm: Normal rate and regular rhythm.  Pulmonary:     Effort: Pulmonary effort is normal.     Breath sounds: Normal breath sounds. No wheezing, rhonchi or rales.   Neurological:     Mental Status: She is alert.   Psychiatric:        Mood and Affect: Mood normal.        Behavior: Behavior normal.     Lab Results  Component Value Date   WBC 6.3 12/07/2023   HGB 12.4 12/07/2023   HCT  38.8 12/07/2023   PLT 318 12/07/2023   GLUCOSE 79 12/07/2023   CHOL 222 (H) 12/07/2023   TRIG 124 12/07/2023   HDL 79 12/07/2023   LDLCALC 122 (H) 12/07/2023   ALT 14 12/07/2023   AST 14 12/07/2023   NA 137 12/07/2023   K 4.9 12/07/2023   CL 102 12/07/2023   CREATININE 0.66 12/07/2023   BUN 14 12/07/2023   CO2 21 12/07/2023   TSH 1.900 10/29/2021     Assessment & Plan:  Annual physical exam Assessment & Plan: Doing well.  Need records from [facility].  Preventative health care is up-to-date.     Follow-up:  Annually  Shavonta Gossen DO [facility]

## 2023-12-08 NOTE — Patient Instructions (Signed)
You're doing well.  Follow up annually.  Take care  Dr. Elina Streng  

## 2023-12-28 ENCOUNTER — Other Ambulatory Visit: Payer: Self-pay | Admitting: Allergy & Immunology

## 2024-03-28 ENCOUNTER — Ambulatory Visit: Payer: BC Managed Care – PPO | Admitting: Allergy & Immunology

## 2024-03-28 ENCOUNTER — Encounter: Payer: Self-pay | Admitting: Allergy & Immunology

## 2024-03-28 ENCOUNTER — Other Ambulatory Visit: Payer: Self-pay

## 2024-03-28 VITALS — BP 108/70 | HR 70 | Temp 98.6°F | Resp 16 | Ht 63.5 in | Wt 130.7 lb

## 2024-03-28 DIAGNOSIS — J302 Other seasonal allergic rhinitis: Secondary | ICD-10-CM

## 2024-03-28 DIAGNOSIS — J3089 Other allergic rhinitis: Secondary | ICD-10-CM

## 2024-03-28 DIAGNOSIS — K219 Gastro-esophageal reflux disease without esophagitis: Secondary | ICD-10-CM

## 2024-03-28 MED ORDER — CLOBETASOL PROP EMOLLIENT BASE 0.05 % EX CREA: 1.0000 | TOPICAL_CREAM | Freq: Two times a day (BID) | CUTANEOUS | 0 refills | Status: AC

## 2024-03-28 MED ORDER — RYALTRIS 665-25 MCG/ACT NA SUSP: NASAL | 3 refills | Status: AC

## 2024-03-28 MED ORDER — PANTOPRAZOLE SODIUM 40 MG PO TBEC: 40.0000 mg | DELAYED_RELEASE_TABLET | Freq: Two times a day (BID) | ORAL | 3 refills | Status: AC

## 2024-03-28 MED ORDER — PANTOPRAZOLE SODIUM 40 MG PO TBEC: 40.0000 mg | DELAYED_RELEASE_TABLET | Freq: Two times a day (BID) | ORAL | 3 refills | Status: DC

## 2024-03-28 MED ORDER — MONTELUKAST SODIUM 10 MG PO TABS: 10.0000 mg | ORAL_TABLET | Freq: Every day | ORAL | 3 refills | Status: AC

## 2024-03-28 MED ORDER — MONTELUKAST SODIUM 10 MG PO TABS: 10.0000 mg | ORAL_TABLET | Freq: Every day | ORAL | 3 refills | Status: DC

## 2024-03-28 NOTE — Progress Notes (Unsigned)
   FOLLOW UP  Date of Service/Encounter:  03/28/24   Assessment:   Seasonal and perennial allergic rhinitis (grasses, ragweed, weeds, trees, indoor molds, outdoor molds, dust mites, and cat) - doing well with medications alone   GERD - on Protonix    Food sensitivities   Plan/Recommendations:   There are no Patient Instructions on file for this visit.   Subjective:   Kathy Graham is a 43 y.o. female presenting today for follow up of  Chief Complaint  Patient presents with  . Follow-up  . Allergic Rhinitis     Kathy Graham has a history of the following: Patient Active Problem List   Diagnosis Date Noted  . Recurrent sinusitis 05/06/2022  . Allergic rhinitis 05/06/2022  . Annual physical exam 11/04/2021  . GERD (gastroesophageal reflux disease) 04/28/2019  . Anxiety and depression 12/09/2012    History obtained from: chart review and {Persons; PED relatives w/patient:19415::patient}.  Discussed the use of AI scribe software for clinical note transcription with the patient and/or guardian, who gave verbal consent to proceed.  Kathy Graham is a 43 y.o. female presenting for {Blank single:19197::a food challenge,a drug challenge,skin testing,a sick visit,an evaluation of ***,a follow up visit}.  She was last seen in October 2024.  At that time, we continue with Allegra  as well as Singulair  and Ryaltris .  She decided to hold off on allergy  shots at that time.  Asthma/Respiratory Symptom History: ***  Allergic Rhinitis Symptom History: ***  Food Allergy  Symptom History: ***  Skin Symptom History: ***  GERD Symptom History: ***  Infection Symptom History: ***  Otherwise, there have been no changes to her past medical history, surgical history, family history, or social history.    Review of systems otherwise negative other than that mentioned in the HPI.    Objective:   There were no vitals taken for this visit. There is no height or weight  on file to calculate BMI.    Physical Exam   Diagnostic studies: {Blank single:19197::none,deferred due to recent antihistamine use,deferred due to insurance stipulations that require a separate visit for testing,labs sent instead, }  Spirometry: {Blank single:19197::results normal (FEV1: ***%, FVC: ***%, FEV1/FVC: ***%),results abnormal (FEV1: ***%, FVC: ***%, FEV1/FVC: ***%)}.    {Blank single:19197::Spirometry consistent with mild obstructive disease,Spirometry consistent with moderate obstructive disease,Spirometry consistent with severe obstructive disease,Spirometry consistent with possible restrictive disease,Spirometry consistent with mixed obstructive and restrictive disease,Spirometry uninterpretable due to technique,Spirometry consistent with normal pattern}. {Blank single:19197::Albuterol/Atrovent  nebulizer,Xopenex/Atrovent  nebulizer,Albuterol nebulizer,Albuterol four puffs via MDI,Xopenex four puffs via MDI} treatment given in clinic with {Blank single:19197::significant improvement in FEV1 per ATS criteria,significant improvement in FVC per ATS criteria,significant improvement in FEV1 and FVC per ATS criteria,improvement in FEV1, but not significant per ATS criteria,improvement in FVC, but not significant per ATS criteria,improvement in FEV1 and FVC, but not significant per ATS criteria,no improvement}.  Allergy  Studies: {Blank single:19197::none,deferred due to recent antihistamine use,deferred due to insurance stipulations that require a separate visit for testing,labs sent instead, }    {Blank single:19197::Allergy  testing results were read and interpreted by myself, documented by clinical staff., }      Marty Shaggy, MD  [facility]

## 2024-03-28 NOTE — Patient Instructions (Addendum)
 1. Seasonal and perennial allergic rhinitis - I am SO GLAD that you are doing well with decreased sinus infections.  - Previous testing demonstrated: grasses, ragweed, weeds, trees, indoor molds, outdoor molds, dust mites, and cat. - Continue with: Allegra  (fexofenadine ) 180mg  tablet once daily (CAN DOUBLE THIS UP IF NEEDED)  - Continue with: Singulair  (montelukast ) 10mg  daily and Ryaltris  (olopatadine/mometasone) two sprays per nostril 1-2 times daily as needed - Singulair  can cause irritability and bad dreams, so beware of that!  - You can use an extra dose of the antihistamine, if needed, for breakthrough symptoms.  - Consider nasal saline rinses 1-2 times daily to remove allergens from the nasal cavities as well as help with mucous clearance (this is especially helpful to do before the nasal sprays are given) - We can hold off on allergy  shots for now.  - The supplements are fine, but typically they have to be dosed very frequently.  - But you know your body better, so you can make the decision about the shots.   2. Return in about 1 year (around 03/28/2025). You can have the follow up appointment with Dr. Iva or a Nurse Practicioner (our Nurse Practitioners are excellent and always have Physician oversight!).    Please inform us  of any Emergency Department visits, hospitalizations, or changes in symptoms. Call us  before going to the ED for breathing or allergy  symptoms since we might be able to fit you in for a sick visit. Feel free to contact us  anytime with any questions, problems, or concerns.  It was a pleasure to see you again today!  Websites that have reliable patient information: 1. American Academy of Asthma, Allergy , and Immunology: www.aaaai.org 2. Food Allergy  Research and Education (FARE): foodallergy.org 3. Mothers of Asthmatics: http://www.asthmacommunitynetwork.org 4. American College of Allergy , Asthma, and Immunology: www.acaai.org      "Like" us  on Facebook  and Instagram for our latest updates!      A healthy democracy works best when [employer] participate! Make sure you are registered to vote! If you have moved or changed any of your contact information, you will need to get this updated before voting! Scan the QR codes below to learn more!

## 2024-06-20 ENCOUNTER — Telehealth: Admitting: Physician Assistant

## 2024-06-20 DIAGNOSIS — J019 Acute sinusitis, unspecified: Secondary | ICD-10-CM

## 2024-06-20 DIAGNOSIS — B9689 Other specified bacterial agents as the cause of diseases classified elsewhere: Secondary | ICD-10-CM

## 2024-06-20 MED ORDER — DOXYCYCLINE HYCLATE 100 MG PO TABS: 100.0000 mg | ORAL_TABLET | Freq: Two times a day (BID) | ORAL | 0 refills | Status: AC

## 2024-06-20 NOTE — Progress Notes (Signed)
"      E-Visit for Sinus Problems  We are sorry that you are not feeling well.  Here is how we plan to help!  Based on what you have shared with me it looks like you have sinusitis.  Sinusitis is inflammation and infection in the sinus cavities of the head.  Based on your presentation I believe you most likely have Acute Bacterial Sinusitis.  This is an infection caused by bacteria and is treated with antibiotics. I have prescribed doxycycline .  You may use an oral decongestant such as Mucinex D or if you have glaucoma or high blood pressure use plain Mucinex. Saline nasal spray help and can safely be used as often as needed for congestion.  If you develop worsening sinus pain, fever or notice severe headache and vision changes, or if symptoms are not better after completion of antibiotic, please schedule an appointment with a health care provider.    Sinus infections are not as easily transmitted as other respiratory infection, however we still recommend that you avoid close contact with loved ones, especially the very young and elderly.  Remember to wash your hands thoroughly throughout the day as this is the number one way to prevent the spread of infection!  Home Care: Only take medications as instructed by your medical team. Complete the entire course of an antibiotic. Do not take these medications with alcohol. A steam or ultrasonic humidifier can help congestion.  You can place a towel over your head and breathe in the steam from hot water  coming from a faucet. Avoid close contacts especially the very young and the elderly. Cover your mouth when you cough or sneeze. Always remember to wash your hands.  Get Help Right Away If: You develop worsening fever or sinus pain. You develop a severe head ache or visual changes. Your symptoms persist after you have completed your treatment plan.  Make sure you Understand these instructions. Will watch your condition. Will get help right away if you  are not doing well or get worse.  Your e-visit answers were reviewed by a board certified advanced clinical practitioner to complete your personal care plan.  Depending on the condition, your plan could have included both over the counter or prescription medications.  If there is a problem please reply  once you have received a response from your provider.  Your safety is important to us .  If you have drug allergies check your prescription carefully.    You can use MyChart to ask questions about todays visit, request a non-urgent call back, or ask for a work or school excuse for 24 hours related to this e-Visit. If it has been greater than 24 hours you will need to follow up with your provider, or enter a new e-Visit to address those concerns.  You will get an e-mail in the next two days asking about your experience.  I hope that your e-visit has been valuable and will speed your recovery. Thank you for using e-visits.  I have spent 5 minutes in review of e-visit questionnaire, review and updating patient chart, medical decision making and response to patient.   Tarren Velardi, FNP     "

## 2024-06-26 ENCOUNTER — Other Ambulatory Visit: Payer: Self-pay

## 2024-06-26 ENCOUNTER — Ambulatory Visit: Payer: Self-pay

## 2024-06-26 ENCOUNTER — Ambulatory Visit: Admission: EM | Admit: 2024-06-26 | Discharge: 2024-06-26 | Disposition: A | Discharge status: Home / Self Care

## 2024-06-26 DIAGNOSIS — J019 Acute sinusitis, unspecified: Secondary | ICD-10-CM | POA: Diagnosis not present

## 2024-06-26 NOTE — ED Provider Notes (Signed)
 [facility]    CSN: [id number] Arrival date & time: 06/26/24  1614      History   Chief Complaint Chief Complaint  Patient presents with   Nasal Congestion    HPI NEDDA GAINS is a 44 y.o. female.   HPI Pt is here today with concerns of sinus infection. She states that she has been fighting a sinus infection for about 2 weeks and symptoms seemed to be resolving but then returned so she was seen for telehealth visit on 06/20/24. She is on day 6 of doxycycline  and states her symptoms seemed to be getting better but yesterday she started to notice flushing and redness in her face, sweating and elevated temperature. She reports that she is still having some nasal congestion but this has improved since starting the Abx. She also reports some coughing and chest tightness    Past Medical History:  Diagnosis Date   Recurrent upper respiratory infection (URI)    Reflux     Patient Active Problem List   Diagnosis Date Noted   Recurrent sinusitis 05/06/2022   Allergic rhinitis 05/06/2022   Annual physical exam 11/04/2021   GERD (gastroesophageal reflux disease) 04/28/2019   Anxiety and depression 12/09/2012    Past Surgical History:  Procedure Laterality Date   BACK SURGERY     BIOPSY  12/16/2018   Procedure: BIOPSY;  Surgeon: Harvey Margo LITTIE, MD;  Location: [facility];  Service: Endoscopy;;  esophagus gastric    COLONOSCOPY N/A 10/26/2019   Procedure: COLONOSCOPY;  Surgeon: Harvey Margo LITTIE, MD;  Location: [facility];  Service: Endoscopy;  Laterality: N/A;  930am   ESOPHAGOGASTRODUODENOSCOPY N/A 12/16/2018   Dr. Harvey: Patchy mild inflammation characterized by congestion and erythema in the gastric body and gastric antrum.  Web found in the distal esophagus status post dilation.  Gastric biopsies showed gastritis but no H. pylori.  Esophageal biopsy showed reflux.   POLYPECTOMY  10/26/2019   Procedure: POLYPECTOMY;  Surgeon: Harvey Margo LITTIE, MD;  Location: [facility];  Service: Endoscopy;;  descending    OB History   No obstetric history on file.      Home Medications    Prior to Admission medications  Medication Sig Start Date End Date Taking? Authorizing Provider  Clobetasol  Prop Emollient Base 0.05 % emollient cream Apply 1 Application topically 2 (two) times daily. Use for 1-2 weeks. 03/28/24   Iva Marty Saltness, MD  doxycycline  (VIBRA -TABS) 100 MG tablet Take 1 tablet (100 mg total) by mouth 2 (two) times daily for 10 days. 06/20/24 06/30/24  Blair, Diane W, FNP  drospirenone-ethinyl estradiol (YAZ,GIANVI,LORYNA) 3-0.02 MG tablet Take 1 tablet by mouth daily.    [provider]  fexofenadine  (ALLEGRA  ALLERGY ) 180 MG tablet Take 180 mg by mouth daily.    [provider]  montelukast  (SINGULAIR ) 10 MG tablet Take 1 tablet (10 mg total) by mouth at bedtime. 03/28/24   Iva Marty Saltness, MD  Olopatadine-Mometasone (RYALTRIS ) 665-25 MCG/ACT SUSP Place two sprays per nostril 1-2 times daily as needed 03/28/24   Iva Marty Saltness, MD  pantoprazole  (PROTONIX ) 40 MG tablet Take 1 tablet (40 mg total) by mouth 2 (two) times daily. 03/28/24   Iva Marty Saltness, MD    Family History Family History  Problem Relation Age of Onset   Allergic rhinitis Mother    Hyperlipidemia Mother    Colon polyps Mother        before age 59   Hypothyroidism  Mother    Hypertension Father    Colon polyps Father        before age 60   Allergic rhinitis Brother    Cancer Brother 61       melanoma   Colon cancer Other     Social History Social History[1]   Allergies   Augmentin [amoxicillin -pot clavulanate] and Cefzil [cefprozil]   Review of Systems Review of Systems  Constitutional:  Positive for diaphoresis. Negative for chills and fever.  HENT:  Positive for congestion, sinus pressure and sinus pain.   Respiratory:  Positive for cough and chest tightness.   Skin:  Positive for color change.     Physical  Exam Triage Vital Signs ED Triage Vitals  Encounter Vitals Group     BP 06/26/24 1713 117/74     Girls Systolic BP Percentile --      Girls Diastolic BP Percentile --      Boys Systolic BP Percentile --      Boys Diastolic BP Percentile --      Pulse Rate 06/26/24 1713 66     Resp 06/26/24 1713 16     Temp 06/26/24 1713 98.3 F (36.8 C)     Temp Source 06/26/24 1713 Oral     SpO2 06/26/24 1713 99 %     Weight 06/26/24 1713 125 lb (56.7 kg)     Height 06/26/24 1713 5' 4 (1.626 m)     Head Circumference --      Peak Flow --      Pain Score 06/26/24 1742 6     Pain Loc --      Pain Education --      Exclude from Growth Chart --    No data found.  Updated Vital Signs BP 117/74 (BP Location: Right Arm)   Pulse 66   Temp 98.3 F (36.8 C) (Oral)   Resp 16   Ht 5' 4 (1.626 m)   Wt 125 lb (56.7 kg)   SpO2 99%   BMI 21.46 kg/m   Visual Acuity Right Eye Distance:   Left Eye Distance:   Bilateral Distance:    Right Eye Near:   Left Eye Near:    Bilateral Near:     Physical Exam Vitals reviewed.  Constitutional:      General: She is awake. She is not in acute distress.    Appearance: Normal appearance. She is well-developed and well-groomed. She is not ill-appearing, toxic-appearing or diaphoretic.  HENT:     Head: Normocephalic and atraumatic.     Nose:     Right Sinus: No maxillary sinus tenderness or frontal sinus tenderness.     Left Sinus: No maxillary sinus tenderness or frontal sinus tenderness.  Cardiovascular:     Rate and Rhythm: Normal rate and regular rhythm.     Heart sounds: Normal heart sounds. No murmur heard.    No friction rub. No gallop.  Pulmonary:     Effort: Pulmonary effort is normal.     Breath sounds: Normal breath sounds. No decreased air movement. No decreased breath sounds, wheezing, rhonchi or rales.  Lymphadenopathy:     Head:     Right side of head: No submental, submandibular or preauricular adenopathy.     Left side of head: No  submental, submandibular or preauricular adenopathy.     Cervical:     Right cervical: No superficial cervical adenopathy.    Left cervical: No superficial cervical adenopathy.  Skin:    General:  Skin is warm and dry.  Neurological:     Mental Status: She is alert.  Psychiatric:        Attention and Perception: Attention and perception normal.        Mood and Affect: Mood and affect normal.        Speech: Speech normal.        Behavior: Behavior normal. Behavior is cooperative.      UC Treatments / Results  Labs (all labs ordered are listed, but only abnormal results are displayed) Labs Reviewed - No data to display  EKG   Radiology No results found.  Procedures Procedures (including critical care time)  Medications Ordered in UC Medications - No data to display  Initial Impression / Assessment and Plan / UC Course  I have reviewed the triage vital signs and the nursing notes.  Pertinent labs & imaging results that were available during my care of the patient were reviewed by me and considered in my medical decision making (see chart for details).      Final Clinical Impressions(s) / UC Diagnoses   Final diagnoses:  Acute sinusitis, recurrence not specified, unspecified location   Patient presents today with concerns of a sinus infection.  She reports that her symptoms have been ongoing for about 2 weeks and she is currently on day 6 of a doxycycline  regimen which was prescribed to her on 06/20/2024.  She states that some of her symptoms have improved but yesterday she started to feel flushed and sweaty.  I reviewed her visit notes from her ED visit on 06/20/2024 and management regimen appears appropriate.  Recommend that she finishes the antibiotic regimen and augments this with OTC medications to provide symptomatic relief.  ED and return precautions reviewed and provided in AVS.  Follow-up as needed.    Discharge Instructions      Based on your symptoms and  duration of illness, I believe you may have a bacterial sinus infection  These typically resolve with antibiotic therapy along with at-home comfort measures. Since you are potentially having side effects, I recommend discontinuing your Doxycycline  for now and proceeding with home measures and OTC medications to help with your symptoms.   It can take a few days for the antibiotic to kick in so I recommend symptomatic relief with over the counter medication such as the following: I recommend that you take Mucinex, Robitussin, Tylenol  instead of the combination medications.  Combination medications typically have a decongestant in them that can cause your blood pressure to be high. I also recommend sinus flushes and nasal saline, a humidifier at night and warm steam/ warm showers to help with symptoms    These medications typically have Tylenol  in them already so you do not need to supplement with more outside of those medications  Stay well hydrated with at least 75 oz of water  per day to help with recovery  If at any point you start to develop swelling around the eyes and nose, trouble seeing, fevers that are not responding to medications, trouble breathing, passing out or headaches that are very severe please go to the emergency room for further evaluation management      ED Prescriptions   None    PDMP not reviewed this encounter.     [1]  Social History Tobacco Use   Smoking status: Never    Passive exposure: Never   Smokeless tobacco: Never  Vaping Use   Vaping status: Never Used  Substance Use Topics  Alcohol use: Yes    Comment: occ glass of wine   Drug use: No     Marylene Rocky BRAVO, PA-C 06/30/24 0828  "

## 2024-06-26 NOTE — Discharge Instructions (Addendum)
 Based on your symptoms and duration of illness, I believe you may have a bacterial sinus infection  These typically resolve with antibiotic therapy along with at-home comfort measures. Since you are potentially having side effects, I recommend discontinuing your Doxycycline  for now and proceeding with home measures and OTC medications to help with your symptoms.   It can take a few days for the antibiotic to kick in so I recommend symptomatic relief with over the counter medication such as the following: I recommend that you take Mucinex, Robitussin, Tylenol  instead of the combination medications.  Combination medications typically have a decongestant in them that can cause your blood pressure to be high. I also recommend sinus flushes and nasal saline, a humidifier at night and warm steam/ warm showers to help with symptoms    These medications typically have Tylenol  in them already so you do not need to supplement with more outside of those medications  Stay well hydrated with at least 75 oz of water  per day to help with recovery  If at any point you start to develop swelling around the eyes and nose, trouble seeing, fevers that are not responding to medications, trouble breathing, passing out or headaches that are very severe please go to the emergency room for further evaluation management

## 2024-06-26 NOTE — ED Triage Notes (Signed)
 Pt presents with a chief complaint of sinus pressure. Symptoms have been present for approximately two weeks. Also endorses cough and low grade fevers (feeling flushed) today. This morning temperature was 99.6 F. OTC Tylenol  taken at home. Has also been taking prescribed doxycycline  from telehealth visit on 12/30. On day six of the antibiotic. Feels like her sxs are becoming worse.

## 2024-06-26 NOTE — Telephone Encounter (Signed)
 FYI Only or Action Required?: FYI only for provider: urgent care advised.  Patient was last seen in primary care on 12/08/2023 by Cook, Jayce G, DO.  Called Nurse Triage reporting Sinusitis.  Symptoms began a week ago.  Interventions attempted: Prescription medications: Augmentin and Rest, hydration, or home remedies.  Symptoms are: gradually worsening.  Triage Disposition: See HCP Within 4 Hours (Or PCP Triage)  Patient/caregiver understands and will follow disposition?: Yes, but will wait  Reason for Disposition  [1] Redness or swelling on the cheek, forehead or around the eye AND [2] new since starting antibiotics  Answer Assessment - Initial Assessment Questions Evisit 06/20/24 dx sinus infection, Augmentin X 7 days, on day 6 today and no improvement. Requesting sdv or appointment before 8am or after 5pm tomorrow.  No SDV available advised urgent care, she is agreeable but states she may wait until tomorrow.    1. ANTIBIOTIC: What antibiotic are you taking? How many times a day?     Augmentin 2. ONSET: When was the antibiotic started?     06/20/24 3. PAIN: How bad is the pain?   (Scale 0-10; or none, mild, moderate or severe)     moderate 4. FEVER: Do you have a fever? If Yes, ask: What is it, how was it measured, and when did it start?      99.6 5. SYMPTOMS: Are there any other symptoms you're concerned about? If Yes, ask: When did it start?     Pain in between eyes and on cheeks, chest tightness but not pain. Flushed skin.  6. PREGNANCY: Is there any chance you are pregnant? When was your last menstrual period?  Protocols used: Sinus Infection on Antibiotic Follow-up Call-[facility]  Message from [facility] sent at 06/26/2024  1:51 PM EST  Reason for Triage: face is flushed red.. normally cold - temp is 99.5.SABRA 6th day of doxicline.. pain between her eyes and pain on her face.. light tightness in chest.. been going on for 2 weeks - she had an evisit for the  antbiotics

## 2024-06-27 ENCOUNTER — Ambulatory Visit: Payer: Self-pay

## 2025-03-29 ENCOUNTER — Ambulatory Visit: Admitting: Allergy & Immunology
